# Patient Record
Sex: Female | Born: 1970 | Race: Black or African American | Hispanic: No | Marital: Married | Smoking: Never smoker
Health system: Southern US, Community
[De-identification: ages and names within clinical notes are randomized; demographics above are authoritative.]

## PROBLEM LIST (undated history)

## (undated) DIAGNOSIS — Z973 Presence of spectacles and contact lenses: Secondary | ICD-10-CM

## (undated) DIAGNOSIS — E119 Type 2 diabetes mellitus without complications: Secondary | ICD-10-CM

## (undated) DIAGNOSIS — M1990 Unspecified osteoarthritis, unspecified site: Secondary | ICD-10-CM

## (undated) DIAGNOSIS — E039 Hypothyroidism, unspecified: Secondary | ICD-10-CM

## (undated) DIAGNOSIS — Z862 Personal history of diseases of the blood and blood-forming organs and certain disorders involving the immune mechanism: Secondary | ICD-10-CM

## (undated) DIAGNOSIS — N393 Stress incontinence (female) (male): Secondary | ICD-10-CM

## (undated) HISTORY — PX: BREAST CYST ASPIRATION: SHX578

## (undated) HISTORY — DX: Hypothyroidism, unspecified: E03.9

---

## 1997-05-04 HISTORY — PX: KNEE ARTHROSCOPY: SUR90

## 2000-12-28 ENCOUNTER — Other Ambulatory Visit: Admission: RE | Admit: 2000-12-28 | Discharge: 2000-12-28 | Payer: Self-pay | Admitting: Obstetrics and Gynecology

## 2002-01-27 ENCOUNTER — Other Ambulatory Visit: Admission: RE | Admit: 2002-01-27 | Discharge: 2002-01-27 | Payer: Self-pay | Admitting: Obstetrics and Gynecology

## 2003-06-12 ENCOUNTER — Other Ambulatory Visit: Admission: RE | Admit: 2003-06-12 | Discharge: 2003-06-12 | Payer: Self-pay | Admitting: Obstetrics and Gynecology

## 2004-11-20 ENCOUNTER — Other Ambulatory Visit: Admission: RE | Admit: 2004-11-20 | Discharge: 2004-11-20 | Payer: Self-pay | Admitting: Obstetrics and Gynecology

## 2004-11-26 ENCOUNTER — Ambulatory Visit (HOSPITAL_COMMUNITY): Admission: RE | Admit: 2004-11-26 | Discharge: 2004-11-26 | Payer: Self-pay | Admitting: Obstetrics and Gynecology

## 2004-12-05 ENCOUNTER — Encounter: Admission: RE | Admit: 2004-12-05 | Discharge: 2004-12-05 | Payer: Self-pay | Admitting: Obstetrics and Gynecology

## 2004-12-12 ENCOUNTER — Encounter (INDEPENDENT_AMBULATORY_CARE_PROVIDER_SITE_OTHER): Payer: Self-pay | Admitting: Specialist

## 2004-12-12 ENCOUNTER — Encounter: Admission: RE | Admit: 2004-12-12 | Discharge: 2004-12-12 | Payer: Self-pay | Admitting: Obstetrics and Gynecology

## 2005-02-11 ENCOUNTER — Encounter: Admission: RE | Admit: 2005-02-11 | Discharge: 2005-02-11 | Payer: Self-pay | Admitting: Internal Medicine

## 2005-06-08 ENCOUNTER — Encounter (HOSPITAL_COMMUNITY): Admission: RE | Admit: 2005-06-08 | Discharge: 2005-09-06 | Payer: Self-pay | Admitting: Endocrinology

## 2005-12-17 ENCOUNTER — Other Ambulatory Visit: Admission: RE | Admit: 2005-12-17 | Discharge: 2005-12-17 | Payer: Self-pay | Admitting: Obstetrics and Gynecology

## 2005-12-30 ENCOUNTER — Encounter: Admission: RE | Admit: 2005-12-30 | Discharge: 2005-12-30 | Payer: Self-pay | Admitting: Obstetrics and Gynecology

## 2007-04-16 IMAGING — MG MM SCREEN MAMMOGRAM BILATERAL
4 series · 4 of 4 positions shown · non-contrast
Comparison: none

DG SCREEN MAMMOGRAM BILATERAL
Bilateral CC and MLO view(s) were taken.
Prior study comparison: November 26, 2004, bilateral screening mammogram, performed at [facility]

SCREENING MAMMOGRAM:
The breast tissue is heterogeneously dense.  Previously biopsied fibroadenoma, right breast, is 
stable.  There is no dominant mass, architectural distortion or calcification to suggest 
malignancy.

[R CC]
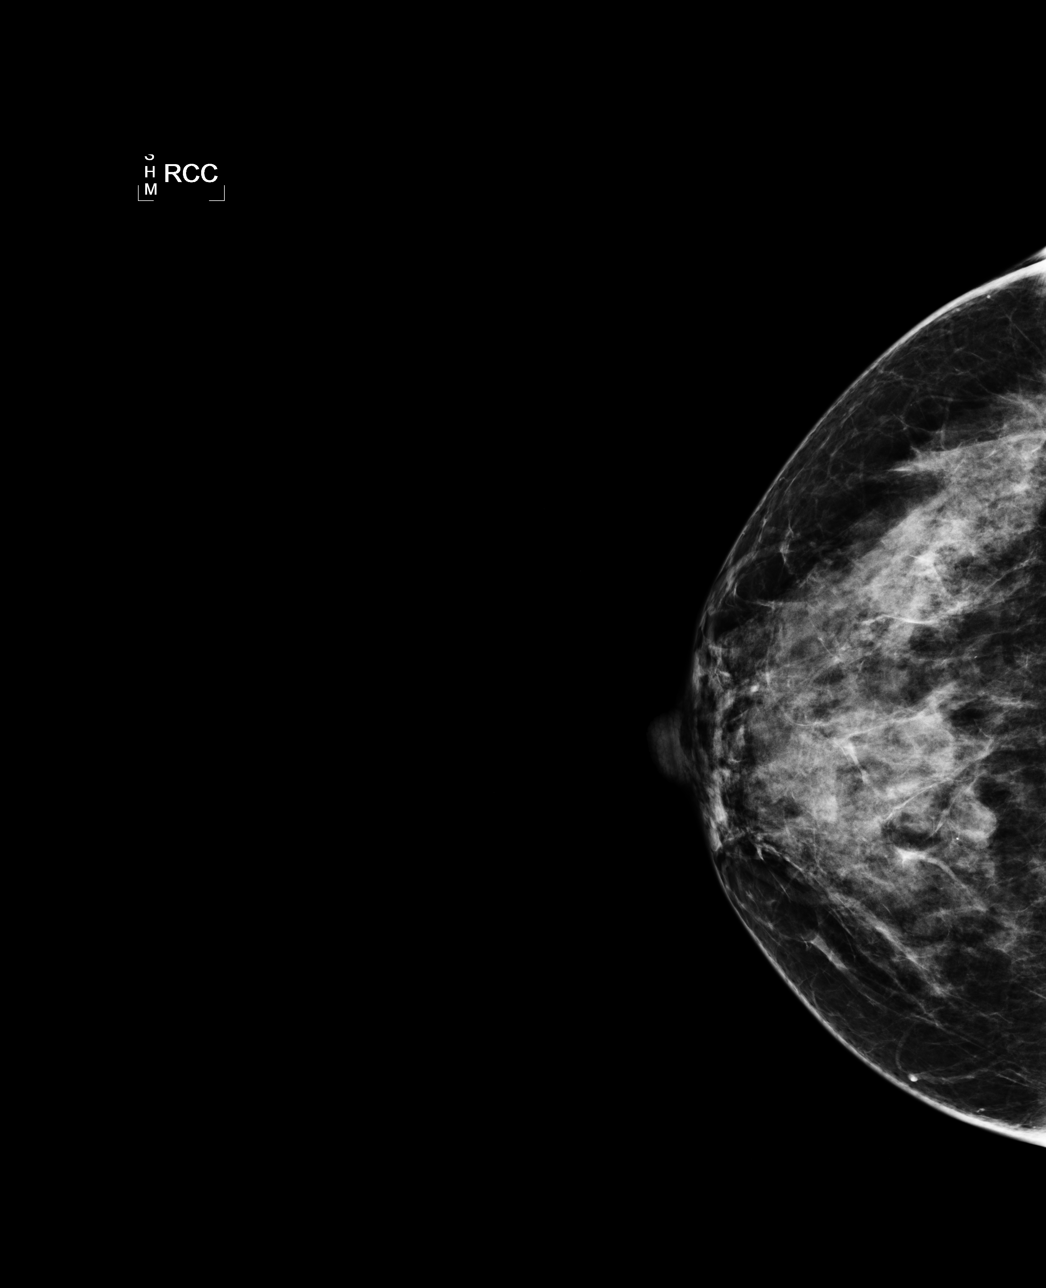

[L CC]
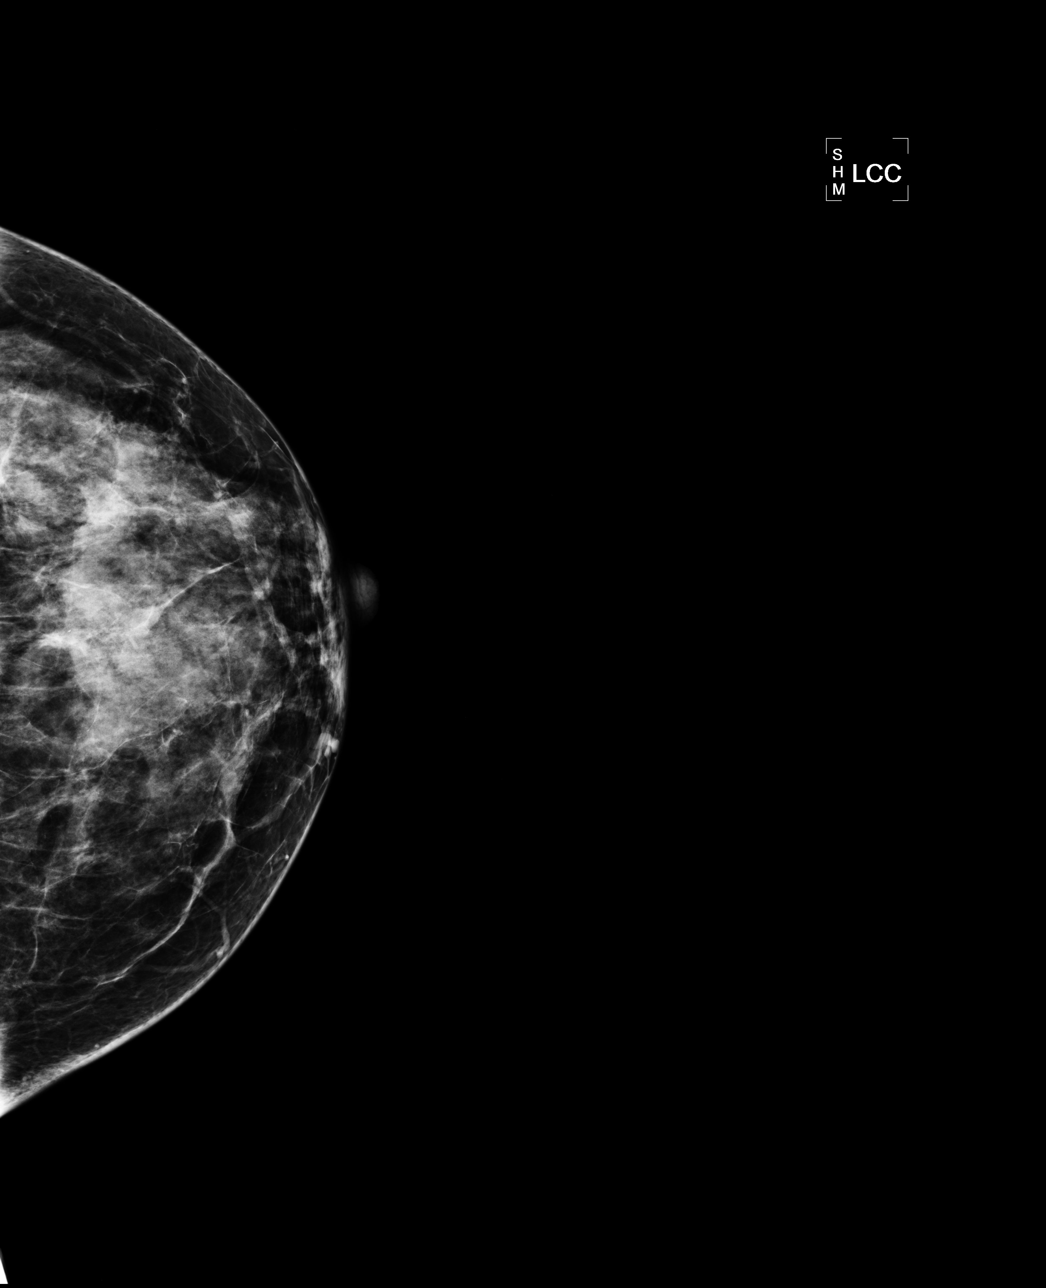

[L MLO]
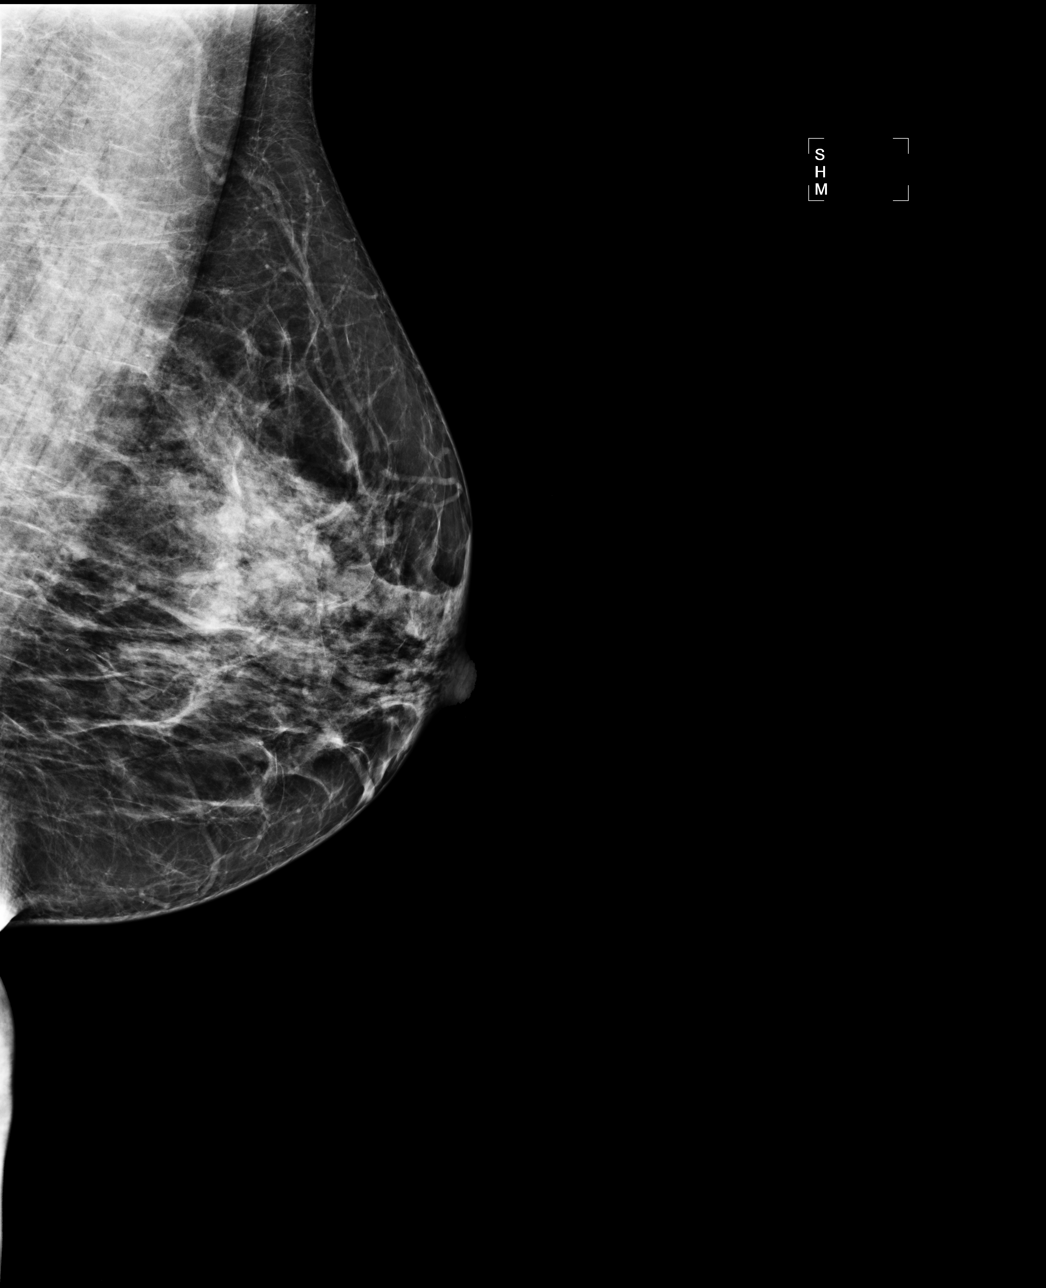

[R MLO]
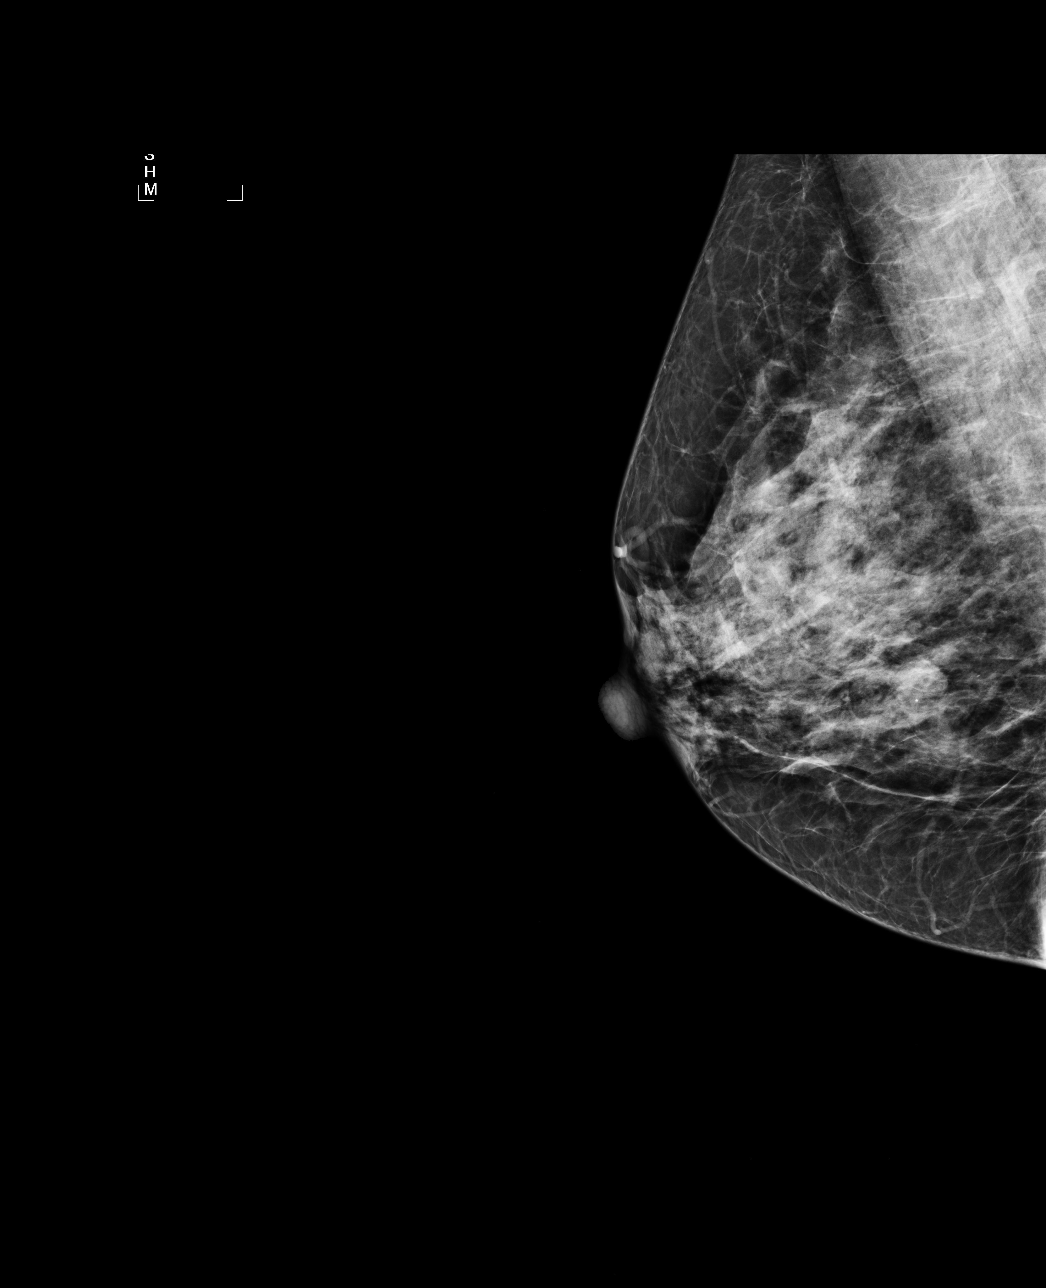

[4 of 4 positions shown; findings below may reference images not displayed]

IMPRESSION: No mammographic evidence of malignancy.  Suggest yearly screening mammography.

ASSESSMENT: Negative - BI-RADS 1

Screening mammogram in 1 year.
ANALYZED BY COMPUTER AIDED DETECTION. , THIS PROCEDURE WAS A DIGITAL MAMMOGRAM.

## 2007-12-07 ENCOUNTER — Encounter: Admission: RE | Admit: 2007-12-07 | Discharge: 2007-12-07 | Payer: Self-pay | Admitting: Obstetrics and Gynecology

## 2007-12-12 ENCOUNTER — Other Ambulatory Visit: Admission: RE | Admit: 2007-12-12 | Discharge: 2007-12-12 | Payer: Self-pay | Admitting: Diagnostic Radiology

## 2007-12-12 ENCOUNTER — Encounter (INDEPENDENT_AMBULATORY_CARE_PROVIDER_SITE_OTHER): Payer: Self-pay | Admitting: Diagnostic Radiology

## 2007-12-12 ENCOUNTER — Encounter: Admission: RE | Admit: 2007-12-12 | Discharge: 2007-12-12 | Payer: Self-pay | Admitting: Obstetrics and Gynecology

## 2008-12-12 ENCOUNTER — Encounter: Admission: RE | Admit: 2008-12-12 | Discharge: 2008-12-12 | Payer: Self-pay | Admitting: Internal Medicine

## 2009-03-12 ENCOUNTER — Ambulatory Visit (HOSPITAL_COMMUNITY): Admission: RE | Admit: 2009-03-12 | Discharge: 2009-03-12 | Payer: Self-pay | Admitting: Obstetrics and Gynecology

## 2009-03-12 ENCOUNTER — Encounter (INDEPENDENT_AMBULATORY_CARE_PROVIDER_SITE_OTHER): Payer: Self-pay | Admitting: Obstetrics and Gynecology

## 2009-03-12 HISTORY — PX: HYSTEROSCOPY W/ ENDOMETRIAL ABLATION: SUR665

## 2010-02-06 ENCOUNTER — Encounter: Admission: RE | Admit: 2010-02-06 | Discharge: 2010-02-06 | Payer: Self-pay | Admitting: Obstetrics and Gynecology

## 2010-05-04 HISTORY — PX: BREAST BIOPSY: SHX20

## 2010-05-24 ENCOUNTER — Encounter: Payer: Self-pay | Admitting: Obstetrics and Gynecology

## 2010-05-25 ENCOUNTER — Encounter: Payer: Self-pay | Admitting: Endocrinology

## 2010-08-06 LAB — CBC
HCT: 36.3 % (ref 36.0–46.0)
Hemoglobin: 12.1 g/dL (ref 12.0–15.0)
MCHC: 33.3 g/dL (ref 30.0–36.0)
MCV: 86.3 fL (ref 78.0–100.0)
Platelets: 319 10*3/uL (ref 150–400)
RBC: 4.21 MIL/uL (ref 3.87–5.11)
RDW: 13.7 % (ref 11.5–15.5)
WBC: 3.5 10*3/uL — ABNORMAL LOW (ref 4.0–10.5)

## 2010-08-06 LAB — HCG, SERUM, QUALITATIVE: Preg, Serum: NEGATIVE

## 2011-01-27 ENCOUNTER — Other Ambulatory Visit: Payer: Self-pay | Admitting: Internal Medicine

## 2011-01-27 DIAGNOSIS — Z1231 Encounter for screening mammogram for malignant neoplasm of breast: Secondary | ICD-10-CM

## 2011-02-09 ENCOUNTER — Ambulatory Visit
Admission: RE | Admit: 2011-02-09 | Discharge: 2011-02-09 | Disposition: A | Discharge status: Home / Self Care | Payer: BC Managed Care – PPO | Source: Ambulatory Visit | Attending: Internal Medicine | Admitting: Internal Medicine

## 2011-02-09 DIAGNOSIS — Z1231 Encounter for screening mammogram for malignant neoplasm of breast: Secondary | ICD-10-CM

## 2011-02-17 ENCOUNTER — Other Ambulatory Visit: Payer: Self-pay | Admitting: Internal Medicine

## 2011-02-17 DIAGNOSIS — R928 Other abnormal and inconclusive findings on diagnostic imaging of breast: Secondary | ICD-10-CM

## 2011-02-27 ENCOUNTER — Ambulatory Visit
Admission: RE | Admit: 2011-02-27 | Discharge: 2011-02-27 | Disposition: A | Discharge status: Home / Self Care | Payer: BC Managed Care – PPO | Source: Ambulatory Visit | Attending: Internal Medicine | Admitting: Internal Medicine

## 2011-02-27 ENCOUNTER — Other Ambulatory Visit: Payer: Self-pay | Admitting: Diagnostic Radiology

## 2011-02-27 ENCOUNTER — Other Ambulatory Visit: Payer: Self-pay | Admitting: Internal Medicine

## 2011-02-27 DIAGNOSIS — R928 Other abnormal and inconclusive findings on diagnostic imaging of breast: Secondary | ICD-10-CM

## 2011-03-03 ENCOUNTER — Other Ambulatory Visit: Payer: Self-pay | Admitting: Internal Medicine

## 2011-03-03 DIAGNOSIS — Z803 Family history of malignant neoplasm of breast: Secondary | ICD-10-CM

## 2012-03-08 ENCOUNTER — Other Ambulatory Visit: Payer: Self-pay | Admitting: Internal Medicine

## 2012-03-08 DIAGNOSIS — Z1231 Encounter for screening mammogram for malignant neoplasm of breast: Secondary | ICD-10-CM

## 2012-04-13 ENCOUNTER — Encounter: Payer: Self-pay | Admitting: Obstetrics and Gynecology

## 2012-04-13 ENCOUNTER — Ambulatory Visit (INDEPENDENT_AMBULATORY_CARE_PROVIDER_SITE_OTHER): Payer: BC Managed Care – PPO | Admitting: Obstetrics and Gynecology

## 2012-04-13 VITALS — BP 120/78 | Resp 14 | Ht 67.0 in | Wt 186.0 lb

## 2012-04-13 DIAGNOSIS — Z01419 Encounter for gynecological examination (general) (routine) without abnormal findings: Secondary | ICD-10-CM

## 2012-04-13 DIAGNOSIS — R350 Frequency of micturition: Secondary | ICD-10-CM

## 2012-04-13 DIAGNOSIS — Z124 Encounter for screening for malignant neoplasm of cervix: Secondary | ICD-10-CM

## 2012-04-13 LAB — POCT URINALYSIS DIPSTICK
Glucose, UA: NEGATIVE
Leukocytes, UA: NEGATIVE
Nitrite, UA: NEGATIVE
Urobilinogen, UA: NEGATIVE

## 2012-04-13 NOTE — Progress Notes (Signed)
Patient ID: Hettie Holstein, female   DOB: 01-07-1971, 41 y.o.   MRN: [id number] Contraception Vas Last pap 04/2011 wnl  Last Mammo 02/2011 Last Colonoscopy never Last Dexa Scan never Primary MD Allyne Gee Abuse at Home none  No complaints.  Scheduled for mammo tomorrow.  Filed Vitals:   04/13/12 1116  BP: 120/78  Resp: 14   ROS: noncontributory  Physical Examination: General appearance - alert, well appearing, and in no distress Neck - supple, no significant adenopathy Chest - clear to auscultation, no wheezes, rales or rhonchi, symmetric air entry Heart - normal rate and regular rhythm Abdomen - soft, nontender, nondistended, no masses or organomegaly Breasts - breasts appear normal, no suspicious masses, no skin or nipple changes or axillary nodes Pelvic - normal external genitalia, vulva, vagina, cervix, uterus and adnexa Back exam - no CVAT Extremities - no edema, redness or tenderness in the calves or thighs  A/P Pap today RTO 26yr for AEX UA - tr prot send for cx

## 2012-04-13 NOTE — Addendum Note (Signed)
Addended by: Marla Roe A on: 04/13/2012 12:27 PM   Modules accepted: Orders

## 2012-04-14 ENCOUNTER — Ambulatory Visit
Admission: RE | Admit: 2012-04-14 | Discharge: 2012-04-14 | Disposition: A | Discharge status: Home / Self Care | Payer: BC Managed Care – PPO | Source: Ambulatory Visit | Attending: Internal Medicine | Admitting: Internal Medicine

## 2012-04-14 DIAGNOSIS — Z1231 Encounter for screening mammogram for malignant neoplasm of breast: Secondary | ICD-10-CM

## 2012-04-14 LAB — PAP IG W/ RFLX HPV ASCU

## 2012-04-15 LAB — URINE CULTURE
Colony Count: NO GROWTH
Organism ID, Bacteria: NO GROWTH

## 2013-04-03 ENCOUNTER — Other Ambulatory Visit: Payer: Self-pay | Admitting: Internal Medicine

## 2013-04-03 DIAGNOSIS — Z1231 Encounter for screening mammogram for malignant neoplasm of breast: Secondary | ICD-10-CM

## 2013-05-15 ENCOUNTER — Ambulatory Visit
Admission: RE | Admit: 2013-05-15 | Discharge: 2013-05-15 | Disposition: A | Discharge status: Home / Self Care | Payer: BC Managed Care – PPO | Source: Ambulatory Visit | Attending: Internal Medicine | Admitting: Internal Medicine

## 2013-05-15 DIAGNOSIS — Z1231 Encounter for screening mammogram for malignant neoplasm of breast: Secondary | ICD-10-CM

## 2014-05-22 ENCOUNTER — Other Ambulatory Visit: Payer: Self-pay

## 2014-05-22 DIAGNOSIS — Z1231 Encounter for screening mammogram for malignant neoplasm of breast: Secondary | ICD-10-CM

## 2014-05-29 ENCOUNTER — Ambulatory Visit
Admission: RE | Admit: 2014-05-29 | Discharge: 2014-05-29 | Disposition: A | Discharge status: Home / Self Care | Payer: BC Managed Care – PPO | Source: Ambulatory Visit

## 2014-05-29 DIAGNOSIS — Z1231 Encounter for screening mammogram for malignant neoplasm of breast: Secondary | ICD-10-CM

## 2014-05-30 ENCOUNTER — Other Ambulatory Visit: Payer: Self-pay | Admitting: Internal Medicine

## 2014-05-30 DIAGNOSIS — R928 Other abnormal and inconclusive findings on diagnostic imaging of breast: Secondary | ICD-10-CM

## 2014-06-04 ENCOUNTER — Ambulatory Visit
Admission: RE | Admit: 2014-06-04 | Discharge: 2014-06-04 | Disposition: A | Discharge status: Home / Self Care | Payer: BC Managed Care – PPO | Source: Ambulatory Visit | Attending: Internal Medicine | Admitting: Internal Medicine

## 2014-06-04 ENCOUNTER — Other Ambulatory Visit: Payer: Self-pay | Admitting: Internal Medicine

## 2014-06-04 DIAGNOSIS — R928 Other abnormal and inconclusive findings on diagnostic imaging of breast: Secondary | ICD-10-CM

## 2014-06-15 ENCOUNTER — Ambulatory Visit
Admission: RE | Admit: 2014-06-15 | Discharge: 2014-06-15 | Disposition: A | Discharge status: Home / Self Care | Payer: BC Managed Care – PPO | Source: Ambulatory Visit | Attending: Internal Medicine | Admitting: Internal Medicine

## 2014-06-15 DIAGNOSIS — R928 Other abnormal and inconclusive findings on diagnostic imaging of breast: Secondary | ICD-10-CM

## 2015-09-30 IMAGING — US US ASPIRATION
1 series · 8 of 8 positions shown · non-contrast
Comparison: Previous exams.

CLINICAL DATA: 43-year-old female with multiple left breast cysts,
focal pain and family history of breast cancer. Patient desires
aspiration of the largest cysts in the upper outer left breast.

EXAM:
ULTRASOUND GUIDED LEFT BREAST CYST ASPIRATION

[Series 1: us aspiration · 8 of 8 slices shown]
[im 1/8]
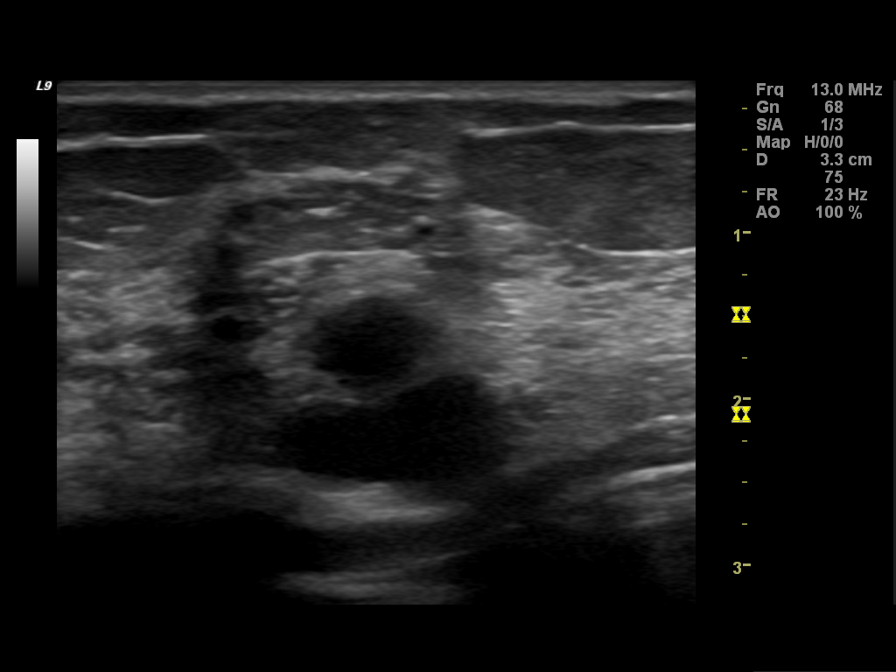
[im 2/8]
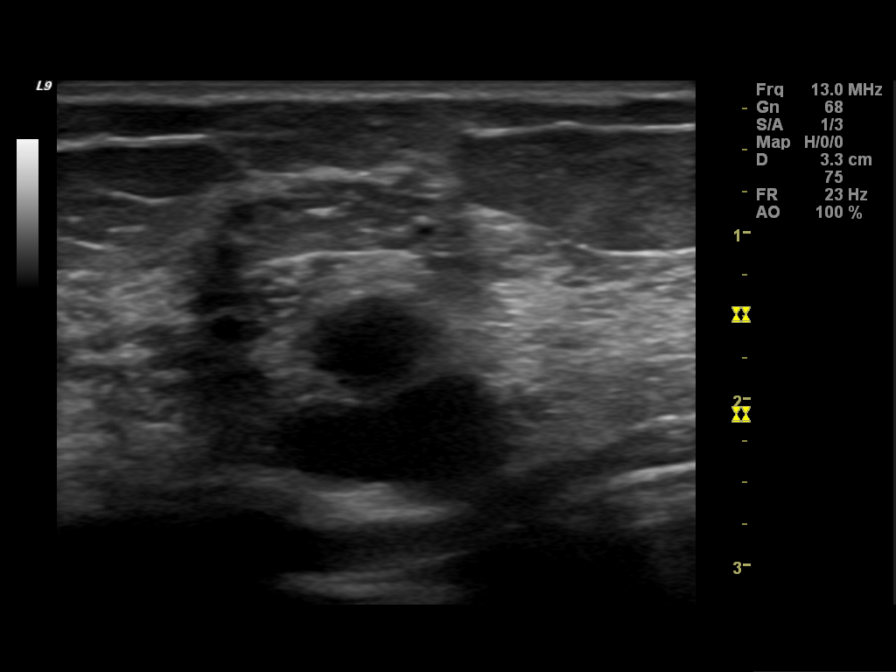
[im 3/8]
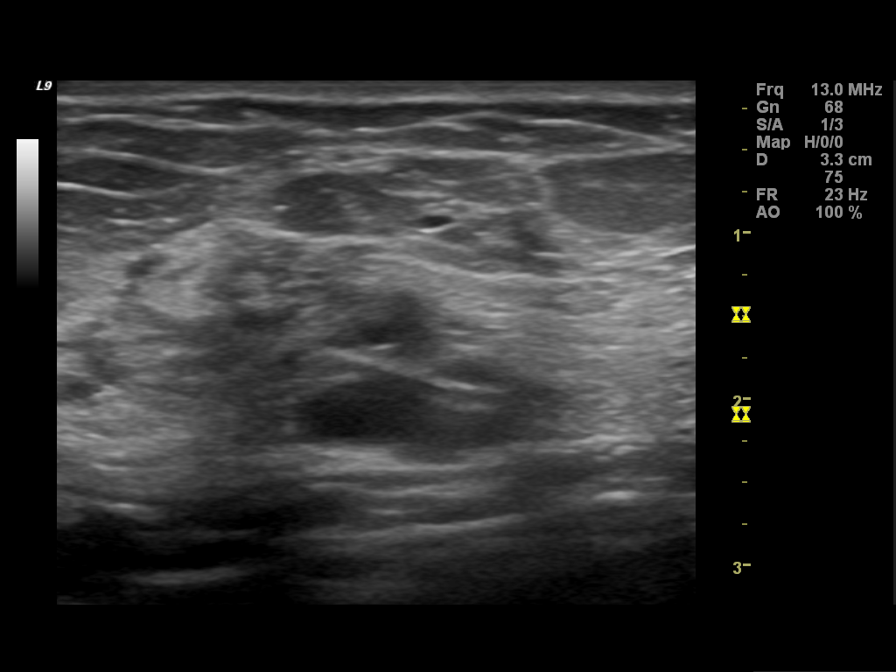
[im 4/8]
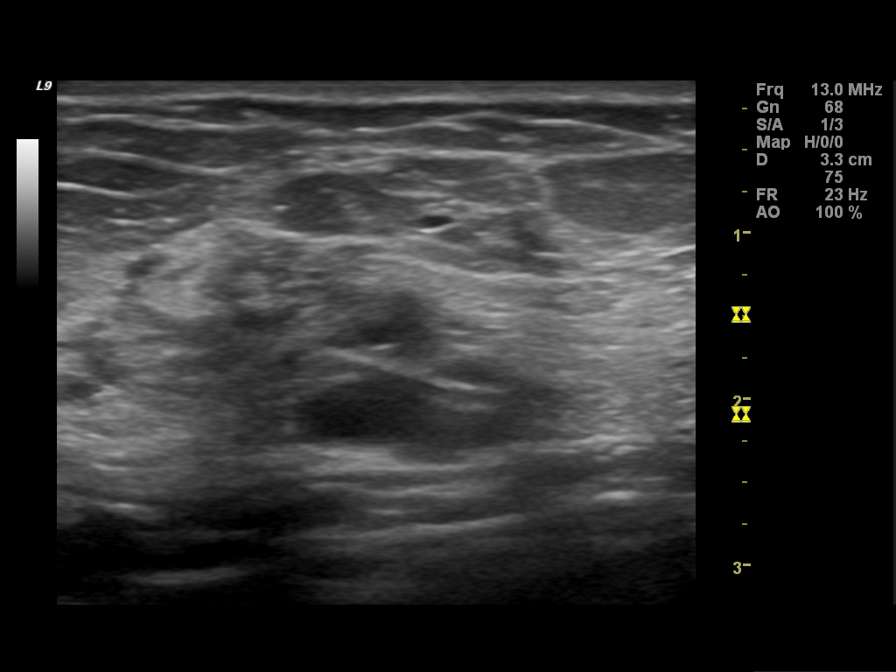
[im 5/8]
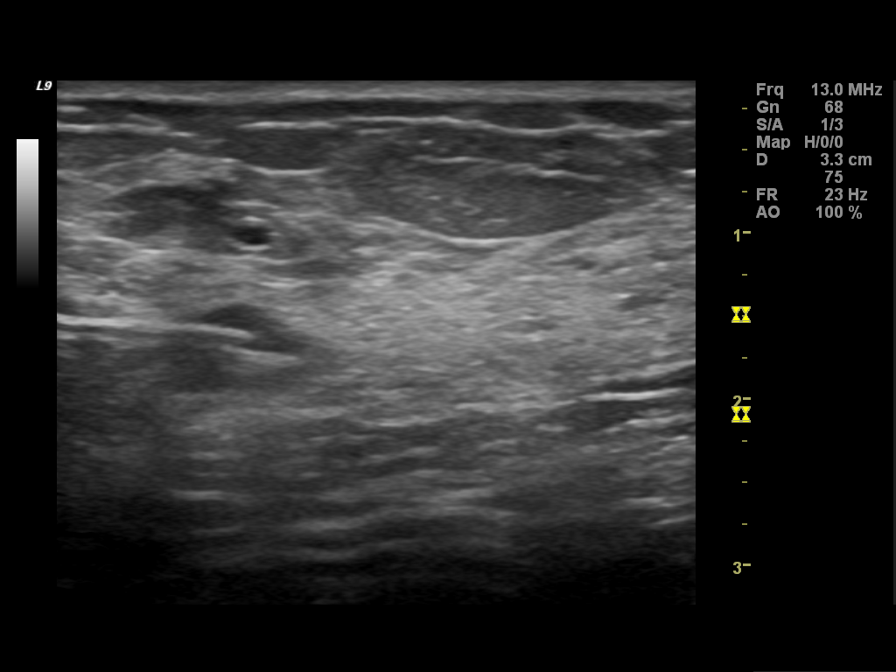
[im 6/8]
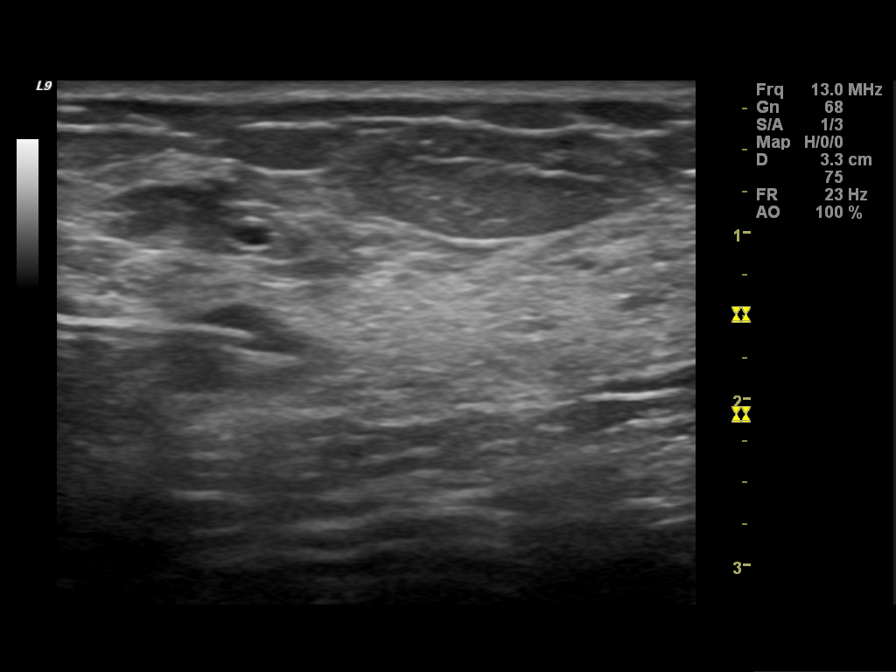
[im 7/8]
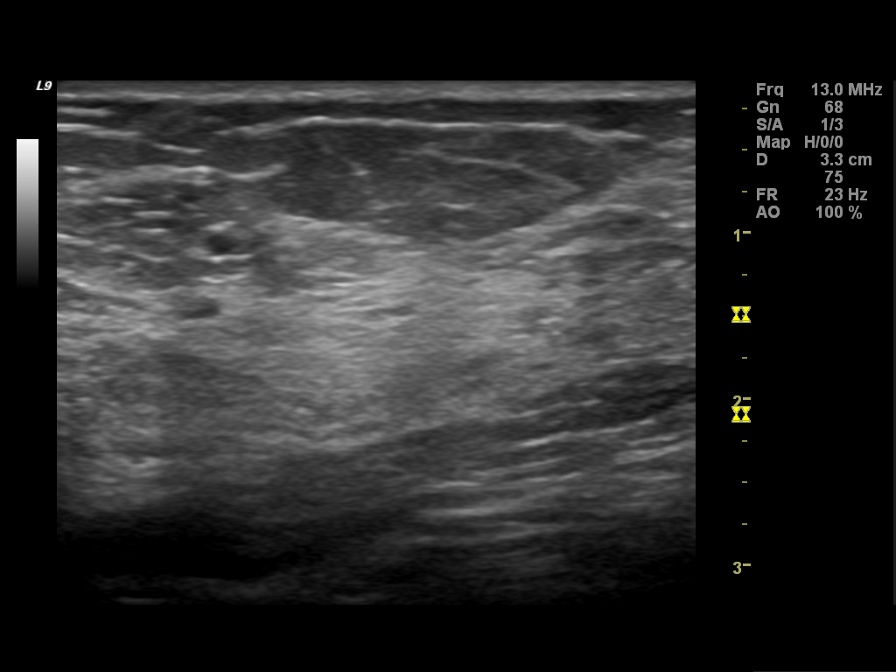
[im 8/8]
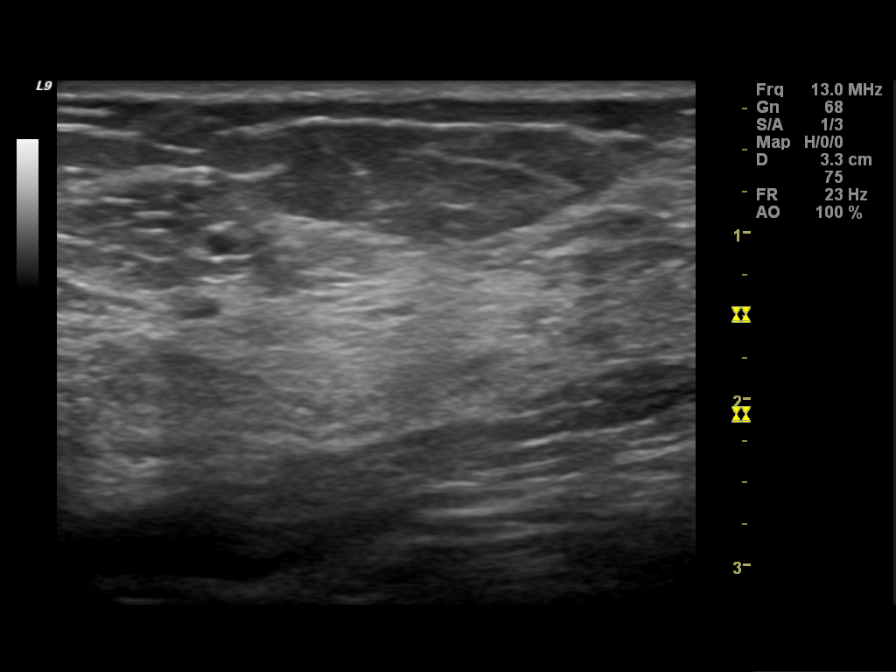

[8 of 8 positions shown; findings below may reference images not displayed]

PROCEDURE:
The risks and benefits of cyst aspiration were discussed with the
patient. She understands that the cysts may not be the source of her
breast pain and also may recur after aspiration.

Using sterile technique, 1% lidocaine, under direct ultrasound
visualization, needle aspiration of the 2 adjacent simple cysts at
the 2 o'clock position of the left breast 4 cm from the nipple was
performed. The largest cyst measured 1.4 x 0.7 cm. Complete
aspiration of these simple cysts was performed without residual soft
tissue component or mass.

1 cc of greenish brown fluid was aspirated and discarded.
IMPRESSION: Ultrasound-guided aspiration of benign cysts in the upper outer left
breast.. No apparent complications.

RECOMMENDATIONS:
Bilateral screening mammograms in 1 year.

## 2016-02-03 ENCOUNTER — Other Ambulatory Visit: Payer: Self-pay | Admitting: Obstetrics and Gynecology

## 2016-02-03 DIAGNOSIS — Z1231 Encounter for screening mammogram for malignant neoplasm of breast: Secondary | ICD-10-CM

## 2016-02-14 ENCOUNTER — Ambulatory Visit
Admission: RE | Admit: 2016-02-14 | Discharge: 2016-02-14 | Disposition: A | Discharge status: Home / Self Care | Payer: BC Managed Care – PPO | Source: Ambulatory Visit | Attending: Obstetrics and Gynecology | Admitting: Obstetrics and Gynecology

## 2016-02-14 DIAGNOSIS — Z1231 Encounter for screening mammogram for malignant neoplasm of breast: Secondary | ICD-10-CM

## 2016-09-30 LAB — LIPID PANEL
HDL: 50 (ref 35–70)
LDL Cholesterol: 123
LDl/HDL Ratio: 2.5
Triglycerides: 73 (ref 40–160)

## 2017-03-22 ENCOUNTER — Other Ambulatory Visit: Payer: Self-pay | Admitting: Internal Medicine

## 2017-03-22 DIAGNOSIS — Z1231 Encounter for screening mammogram for malignant neoplasm of breast: Secondary | ICD-10-CM

## 2017-03-30 ENCOUNTER — Ambulatory Visit
Admission: RE | Admit: 2017-03-30 | Discharge: 2017-03-30 | Disposition: A | Discharge status: Home / Self Care | Payer: BC Managed Care – PPO | Source: Ambulatory Visit | Attending: Internal Medicine | Admitting: Internal Medicine

## 2017-03-30 DIAGNOSIS — Z1231 Encounter for screening mammogram for malignant neoplasm of breast: Secondary | ICD-10-CM

## 2017-04-01 ENCOUNTER — Other Ambulatory Visit: Payer: Self-pay | Admitting: Internal Medicine

## 2017-04-01 DIAGNOSIS — R928 Other abnormal and inconclusive findings on diagnostic imaging of breast: Secondary | ICD-10-CM

## 2017-04-07 ENCOUNTER — Other Ambulatory Visit: Payer: Self-pay | Admitting: Internal Medicine

## 2017-04-07 ENCOUNTER — Ambulatory Visit
Admission: RE | Admit: 2017-04-07 | Discharge: 2017-04-07 | Disposition: A | Discharge status: Home / Self Care | Payer: BC Managed Care – PPO | Source: Ambulatory Visit | Attending: Internal Medicine | Admitting: Internal Medicine

## 2017-04-07 DIAGNOSIS — R928 Other abnormal and inconclusive findings on diagnostic imaging of breast: Secondary | ICD-10-CM

## 2017-04-23 ENCOUNTER — Other Ambulatory Visit: Payer: Self-pay | Admitting: Internal Medicine

## 2017-04-23 DIAGNOSIS — R9230 Dense breasts, unspecified: Secondary | ICD-10-CM

## 2017-04-23 DIAGNOSIS — R922 Inconclusive mammogram: Secondary | ICD-10-CM

## 2017-04-25 LAB — BASIC METABOLIC PANEL
BUN: 7 (ref 4–21)
Creatinine: 0.9 (ref 0.5–1.1)
Glucose: 85
Potassium: 4 (ref 3.4–5.3)
Sodium: 138 (ref 137–147)

## 2017-04-25 LAB — TSH: TSH: 2.03 (ref 0.41–5.90)

## 2017-04-25 LAB — HEMOGLOBIN A1C: HEMOGLOBIN A1C: 6 (ref 4.0–6.0)

## 2017-05-02 ENCOUNTER — Other Ambulatory Visit: Payer: BC Managed Care – PPO

## 2017-05-07 ENCOUNTER — Inpatient Hospital Stay
Admission: RE | Admit: 2017-05-07 | Discharge: 2017-05-07 | Disposition: A | Discharge status: Home / Self Care | Payer: BC Managed Care – PPO | Source: Ambulatory Visit | Attending: Internal Medicine | Admitting: Internal Medicine

## 2018-01-12 LAB — HM PAP SMEAR

## 2018-02-19 ENCOUNTER — Other Ambulatory Visit: Payer: Self-pay | Admitting: Internal Medicine

## 2018-02-21 ENCOUNTER — Ambulatory Visit: Payer: BC Managed Care – PPO | Admitting: Nurse Practitioner

## 2018-02-21 ENCOUNTER — Encounter: Payer: Self-pay | Admitting: Nurse Practitioner

## 2018-02-21 VITALS — BP 116/72 | HR 72 | Temp 97.9°F | Ht 66.0 in | Wt 210.2 lb

## 2018-02-21 DIAGNOSIS — E039 Hypothyroidism, unspecified: Secondary | ICD-10-CM | POA: Diagnosis not present

## 2018-02-21 DIAGNOSIS — R7303 Prediabetes: Secondary | ICD-10-CM | POA: Diagnosis not present

## 2018-02-21 LAB — TSH: TSH: 2.37 (ref 0.41–5.90)

## 2018-02-21 NOTE — Progress Notes (Signed)
  Subjective:     Patient ID: Kara Fry , female    DOB: 1970-10-20 , 47 y.o.   MRN: [id number]   Thyroid Problem  Presents for follow-up visit. Patient reports no constipation, fatigue, menstrual problem, palpitations or weight gain. The symptoms have been stable.     No past medical history on file.    Current Outpatient Medications:  .  levothyroxine (SYNTHROID, LEVOTHROID) 88 MCG tablet, Take 88 mcg by mouth daily., Disp: , Rfl:    Allergies  Allergen Reactions  . Penicillins   . Shellfish Allergy      Review of Systems  Constitutional: Negative for chills, fatigue and weight gain.  Cardiovascular: Negative for palpitations.  Gastrointestinal: Negative for constipation and nausea.  Genitourinary: Negative for menstrual problem.  Skin: Negative.   Neurological: Negative for dizziness, syncope, light-headedness and headaches.     Today's Vitals   02/21/18 1117  BP: 116/72  Pulse: 72  Temp: 97.9 F (36.6 C)  TempSrc: Oral  SpO2: 96%  Weight: 210 lb 3.2 oz (95.3 kg)  Height: 5\' 6"  (1.676 m)  PainSc: 0-No pain   Body mass index is 33.93 kg/m.   Objective:  Physical Exam  Constitutional: She is oriented to person, place, and time. She appears well-developed and well-nourished.  Cardiovascular: Normal rate, regular rhythm and normal heart sounds.  Pulmonary/Chest: Effort normal and breath sounds normal.  Neurological: She is alert and oriented to person, place, and time.  Skin: Skin is warm and dry.        Assessment And Plan:     1. Acquired hypothyroidism  Chronic, controlled  Continue with current medications, will make changes pending lab results - TSH - T3, free - T4  2. Prediabetes  Chronic, controlled  No current medications  Encouraged to increase physical activity and eat healthy diet. - Hemoglobin A1c        Arnette Felts, FNP

## 2018-02-22 LAB — T3, FREE: T3, Free: 2.6 pg/mL (ref 2.0–4.4)

## 2018-02-22 LAB — TSH: TSH: 2.37 u[IU]/mL (ref 0.450–4.500)

## 2018-02-22 LAB — HEMOGLOBIN A1C
Est. average glucose Bld gHb Est-mCnc: 128 mg/dL
HEMOGLOBIN A1C: 6.1 % — AB (ref 4.8–5.6)

## 2018-02-22 LAB — T4: T4, Total: 10.9 ug/dL (ref 4.5–12.0)

## 2018-03-21 ENCOUNTER — Encounter: Payer: Self-pay | Admitting: Internal Medicine

## 2018-03-21 ENCOUNTER — Ambulatory Visit: Payer: BC Managed Care – PPO | Admitting: Internal Medicine

## 2018-03-21 VITALS — BP 116/70 | HR 80 | Temp 98.0°F | Ht 66.5 in | Wt 211.0 lb

## 2018-03-21 DIAGNOSIS — N6320 Unspecified lump in the left breast, unspecified quadrant: Secondary | ICD-10-CM

## 2018-03-21 DIAGNOSIS — Z0000 Encounter for general adult medical examination without abnormal findings: Secondary | ICD-10-CM

## 2018-03-21 DIAGNOSIS — E039 Hypothyroidism, unspecified: Secondary | ICD-10-CM | POA: Insufficient documentation

## 2018-03-21 LAB — CMP14+EGFR
ALBUMIN: 4.1 g/dL (ref 3.5–5.5)
ALT: 16 IU/L (ref 0–32)
AST: 12 IU/L (ref 0–40)
Albumin/Globulin Ratio: 1.4 (ref 1.2–2.2)
Alkaline Phosphatase: 90 IU/L (ref 39–117)
BUN/Creatinine Ratio: 11 (ref 9–23)
BUN: 10 mg/dL (ref 6–24)
Bilirubin Total: 0.3 mg/dL (ref 0.0–1.2)
CHLORIDE: 101 mmol/L (ref 96–106)
CO2: 20 mmol/L (ref 20–29)
Calcium: 9.3 mg/dL (ref 8.7–10.2)
Creatinine, Ser: 0.94 mg/dL (ref 0.57–1.00)
GFR calc Af Amer: 82 mL/min/{1.73_m2} (ref >59)
GFR calc non Af Amer: 71 mL/min/{1.73_m2} (ref >59)
Globulin, Total: 3 g/dL (ref 1.5–4.5)
Glucose: 90 mg/dL (ref 65–99)
Potassium: 4.2 mmol/L (ref 3.5–5.2)
Sodium: 136 mmol/L (ref 134–144)
Total Protein: 7.1 g/dL (ref 6.0–8.5)

## 2018-03-21 LAB — CBC
HEMATOCRIT: 37.8 % (ref 34.0–46.6)
Hemoglobin: 12.8 g/dL (ref 11.1–15.9)
MCH: 28.4 pg (ref 26.6–33.0)
MCHC: 33.9 g/dL (ref 31.5–35.7)
MCV: 84 fL (ref 79–97)
PLATELETS: 392 10*3/uL (ref 150–450)
RBC: 4.5 x10E6/uL (ref 3.77–5.28)
RDW: 14 % (ref 12.3–15.4)
WBC: 5.2 10*3/uL (ref 3.4–10.8)

## 2018-03-21 LAB — LIPID PANEL
CHOL/HDL RATIO: 4.2 ratio (ref 0.0–4.4)
Cholesterol, Total: 220 mg/dL — ABNORMAL HIGH (ref 100–199)
HDL: 53 mg/dL (ref >39)
LDL Calculated: 151 mg/dL — ABNORMAL HIGH (ref 0–99)
TRIGLYCERIDES: 81 mg/dL (ref 0–149)
VLDL Cholesterol Cal: 16 mg/dL (ref 5–40)

## 2018-03-21 LAB — POCT URINALYSIS DIPSTICK
BILIRUBIN UA: NEGATIVE
Glucose, UA: NEGATIVE
Ketones, UA: NEGATIVE
Nitrite, UA: NEGATIVE
Protein, UA: NEGATIVE
Spec Grav, UA: 1.01 (ref 1.010–1.025)
Urobilinogen, UA: 0.2 E.U./dL
pH, UA: 6.5 (ref 5.0–8.0)

## 2018-03-21 NOTE — Patient Instructions (Signed)
Breast Self-Awareness Breast self-awareness means:  Knowing how your breasts look.  Knowing how your breasts feel.  Checking your breasts every month for changes.  Telling your doctor if you notice a change in your breasts.  Breast self-awareness allows you to notice a breast problem early while it is still small. How to do a breast self-exam One way to learn what is normal for your breasts and to check for changes is to do a breast self-exam. To do a breast self-exam: Look for Changes  1. Take off all the clothes above your waist. 2. Stand in front of a mirror in a room with good lighting. 3. Put your hands on your hips. 4. Push your hands down. 5. Look at your breasts and nipples in the mirror to see if one breast or nipple looks different than the other. Check to see if: ? The shape of one breast is different. ? The size of one breast is different. ? There are wrinkles, dips, and bumps in one breast and not the other. 6. Look at each breast for changes in your skin, such as: ? Redness. ? Scaly areas. 7. Look for changes in your nipples, such as: ? Liquid around the nipples. ? Bleeding. ? Dimpling. ? Redness. ? A change in where the nipples are. Feel for Changes 1. Lie on your back on the floor. 2. Feel each breast. To do this, follow these steps: ? Pick a breast to feel. ? Put the arm closest to that breast above your head. ? Use your other arm to feel the nipple area of your breast. Feel the area with the pads of your three middle fingers by making small circles with your fingers. For the first circle, press lightly. For the second circle, press harder. For the third circle, press even harder. ? Keep making circles with your fingers at the light, harder, and even harder pressures as you move down your breast. Stop when you feel your ribs. ? Move your fingers a little toward the center of your body. ? Start making circles with your fingers again, this time going up until  you reach your collarbone. ? Keep making up and down circles until you reach your armpit. Remember to keep using the three pressures. ? Feel the other breast in the same way. 3. Sit or stand in the shower or tub. 4. With soapy water on your skin, feel each breast the same way you did in step 2, when you were lying on the floor. Write Down What You Find  After doing the self-exam, write down:  What is normal for each breast.  Any changes you find in each breast.  When you last had your period.  How often should I check my breasts? Check your breasts every month. If you are breastfeeding, the best time to check them is after you feed your baby or after you use a breast pump. If you get periods, the best time to check your breasts is 5-7 days after your period is over. When should I see my doctor? See your doctor if you notice:  A change in shape or size of your breasts or nipples.  A change in the skin of your breast or nipples, such as red or scaly skin.  Unusual fluid coming from your nipples.  A lump or thick area that was not there before.  Pain in your breasts.  Anything that concerns you.  This information is not intended to replace advice given to   you by your health care provider. Make sure you discuss any questions you have with your health care provider. Document Released: 10/07/2007 Document Revised: 09/26/2015 Document Reviewed: 03/10/2015 Elsevier Interactive Patient Education  2018 Elsevier Inc.  

## 2018-03-22 ENCOUNTER — Encounter: Payer: Self-pay | Admitting: Internal Medicine

## 2018-03-22 NOTE — Progress Notes (Signed)
Subjective:     Patient ID: Kara Fry , female    DOB: [birth year] , 47 y.o.   MRN: [id number]   Chief Complaint  Patient presents with  . Annual Exam    HPI  SHE IS HERE TODAY FOR A FULL PHYSICAL EXAM. SHE HAS HER GYN EXAMS PERFORMED BY DR. Mancel Bale. SHE ADDS THAT HER MAMMOGRAM WAS PERFORMED DURING THIS VISIT AS WELL. THIS WAS PERFORMED WITHIN THE LAST TWO MONTHS.    Past Medical History:  Diagnosis Date  . Hypothyroidism      Family History  Problem Relation Age of Onset  . Breast cancer Mother 19  . Breast cancer Sister 21  . Breast cancer Maternal Aunt   . Breast cancer Maternal Grandmother      Current Outpatient Medications:  .  SYNTHROID 100 MCG tablet, TAKE ONE TABLET MON-SAT AND 1/2 TAB ON SUN, Disp: 90 tablet, Rfl: 0   Allergies  Allergen Reactions  . Penicillins   . Shellfish Allergy      Review of Systems  Constitutional: Negative.   HENT: Negative.   Eyes: Negative.   Respiratory: Negative.   Cardiovascular: Negative.   Gastrointestinal: Negative.   Endocrine: Negative.   Genitourinary: Negative.   Musculoskeletal: Negative.   Skin: Negative.   Allergic/Immunologic: Negative.   Neurological: Negative.   Hematological: Negative.   Psychiatric/Behavioral: Negative.      Today's Vitals   03/21/18 1102  BP: 116/70  Pulse: 80  Temp: 98 F (36.7 C)  TempSrc: Oral  Weight: 211 lb (95.7 kg)  Height: 5' 6.5" (1.689 m)   Body mass index is 33.55 kg/m.   Objective:  Physical Exam  Constitutional: She is oriented to person, place, and time. She appears well-developed and well-nourished.  HENT:  Head: Normocephalic and atraumatic.  Right Ear: External ear normal.  Left Ear: External ear normal.  Nose: Nose normal.  Mouth/Throat: Oropharynx is clear and moist.  Eyes: Pupils are equal, round, and reactive to light. Conjunctivae and EOM are normal.  Neck: Normal range of motion. Neck supple.  Cardiovascular: Normal rate, regular  rhythm, normal heart sounds and intact distal pulses.  Pulmonary/Chest: Effort normal and breath sounds normal. Right breast exhibits no inverted nipple, no mass, no nipple discharge, no skin change and no tenderness. Left breast exhibits mass. Left breast exhibits no inverted nipple, no nipple discharge, no skin change and no tenderness.  TWO ROUND, NONTENDER MASSES APPRECIATED LATERAL TO LEFT NIPPLE.     Abdominal: Soft. Bowel sounds are normal.  Genitourinary:  Genitourinary Comments: deferred  Musculoskeletal: Normal range of motion.  Neurological: She is alert and oriented to person, place, and time.  Skin: Skin is warm and dry.  Psychiatric: She has a normal mood and affect.  Nursing note and vitals reviewed.       Assessment And Plan:     1. Routine general medical examination at health care facility  A full exam was performed. Importance of monthly self breast exams was discussed with the patient. I will request her most recent mammogram results from Dr. Mancel Bale.  PATIENT HAS BEEN ADVISED TO GET 30-45 MINUTES REGULAR EXERCISE NO LESS THAN FOUR TO FIVE DAYS PER WEEK - BOTH WEIGHTBEARING EXERCISES AND AEROBIC ARE RECOMMENDED.  SHE IS ADVISED TO FOLLOW A HEALTHY DIET WITH AT LEAST SIX FRUITS/VEGGIES PER DAY, DECREASE INTAKE OF RED MEAT, AND TO INCREASE FISH INTAKE TO TWO DAYS PER WEEK.  MEATS/FISH SHOULD NOT BE FRIED, BAKED OR BROILED IS PREFERABLE.  I  SUGGEST WEARING SPF 50 SUNSCREEN ON EXPOSED PARTS AND ESPECIALLY WHEN IN THE DIRECT SUNLIGHT FOR AN EXTENDED PERIOD OF TIME.  PLEASE AVOID FAST FOOD RESTAURANTS AND INCREASE YOUR WATER INTAKE.  - CMP14+EGFR - CBC - Lipid panel - POCT Urinalysis Dipstick (81002)   2. Breast mass, left  I will refer her for a left breast ultrasound and left breast diagnostic mammogram. She is in agreement with her treatment plan. I will also request her most recent mammogram results from Dr. Mancel Bale. Additionally, pt and I both reviewed 2018 mammogram  results from the [facility].   Maximino Greenland, MD

## 2018-03-22 NOTE — Progress Notes (Signed)
Your liver and kidney function are normal. Blood count is normal. Ldl, bad chol is 151. Ideally, this should be less than 100. Please exercise no less than five days weekly, avoid fried foods and increase fish intake to twice weekly. If you have not heard from [facility] by Friday, please let me know

## 2018-03-24 ENCOUNTER — Other Ambulatory Visit: Payer: Self-pay | Admitting: Internal Medicine

## 2018-03-24 DIAGNOSIS — N6320 Unspecified lump in the left breast, unspecified quadrant: Secondary | ICD-10-CM

## 2018-04-04 ENCOUNTER — Other Ambulatory Visit: Payer: Self-pay | Admitting: Internal Medicine

## 2018-04-04 ENCOUNTER — Ambulatory Visit
Admission: RE | Admit: 2018-04-04 | Discharge: 2018-04-04 | Disposition: A | Discharge status: Home / Self Care | Payer: BC Managed Care – PPO | Source: Ambulatory Visit | Attending: Internal Medicine | Admitting: Internal Medicine

## 2018-04-04 DIAGNOSIS — N6320 Unspecified lump in the left breast, unspecified quadrant: Secondary | ICD-10-CM

## 2018-04-08 ENCOUNTER — Ambulatory Visit
Admission: RE | Admit: 2018-04-08 | Discharge: 2018-04-08 | Disposition: A | Discharge status: Home / Self Care | Payer: BC Managed Care – PPO | Source: Ambulatory Visit | Attending: Internal Medicine | Admitting: Internal Medicine

## 2018-04-08 DIAGNOSIS — N6320 Unspecified lump in the left breast, unspecified quadrant: Secondary | ICD-10-CM

## 2018-05-04 HISTORY — PX: COLONOSCOPY: SHX174

## 2018-05-25 ENCOUNTER — Other Ambulatory Visit: Payer: Self-pay | Admitting: Nurse Practitioner

## 2018-07-20 ENCOUNTER — Encounter: Payer: Self-pay | Admitting: Internal Medicine

## 2018-09-20 ENCOUNTER — Encounter: Payer: Self-pay | Admitting: Internal Medicine

## 2018-09-20 ENCOUNTER — Other Ambulatory Visit: Payer: Self-pay

## 2018-09-20 ENCOUNTER — Ambulatory Visit: Payer: BC Managed Care – PPO | Admitting: Internal Medicine

## 2018-09-20 VITALS — BP 112/74 | HR 83 | Temp 98.2°F | Ht 66.5 in | Wt 213.2 lb

## 2018-09-20 DIAGNOSIS — E6609 Other obesity due to excess calories: Secondary | ICD-10-CM | POA: Diagnosis not present

## 2018-09-20 DIAGNOSIS — E039 Hypothyroidism, unspecified: Secondary | ICD-10-CM | POA: Diagnosis not present

## 2018-09-20 DIAGNOSIS — R7303 Prediabetes: Secondary | ICD-10-CM

## 2018-09-20 DIAGNOSIS — R350 Frequency of micturition: Secondary | ICD-10-CM

## 2018-09-20 DIAGNOSIS — Z6833 Body mass index (BMI) 33.0-33.9, adult: Secondary | ICD-10-CM

## 2018-09-20 LAB — POCT URINALYSIS DIPSTICK
Bilirubin, UA: NEGATIVE
Glucose, UA: NEGATIVE
Ketones, UA: NEGATIVE
Nitrite, UA: NEGATIVE
Protein, UA: NEGATIVE
Spec Grav, UA: 1.01 (ref 1.010–1.025)
Urobilinogen, UA: 0.2 E.U./dL
pH, UA: 6.5 (ref 5.0–8.0)

## 2018-09-20 NOTE — Patient Instructions (Signed)
Hypothyroidism  Hypothyroidism is when the thyroid gland does not make enough of certain hormones (it is underactive). The thyroid gland is a small gland located in the lower front part of the neck, just in front of the windpipe (trachea). This gland makes hormones that help control how the body uses food for energy (metabolism) as well as how the heart and brain function. These hormones also play a role in keeping your bones strong. When the thyroid is underactive, it produces too little of the hormones thyroxine (T4) and triiodothyronine (T3). What are the causes? This condition may be caused by:  Hashimoto's disease. This is a disease in which the body's disease-fighting system (immune system) attacks the thyroid gland. This is the most common cause.  Viral infections.  Pregnancy.  Certain medicines.  Birth defects.  Past radiation treatments to the head or neck for cancer.  Past treatment with radioactive iodine.  Past exposure to radiation in the environment.  Past surgical removal of part or all of the thyroid.  Problems with a gland in the center of the brain (pituitary gland).  Lack of enough iodine in the diet. What increases the risk? You are more likely to develop this condition if:  You are female.  You have a family history of thyroid conditions.  You use a medicine called lithium.  You take medicines that affect the immune system (immunosuppressants). What are the signs or symptoms? Symptoms of this condition include:  Feeling as though you have no energy (lethargy).  Not being able to tolerate cold.  Weight gain that is not explained by a change in diet or exercise habits.  Lack of appetite.  Dry skin.  Coarse hair.  Menstrual irregularity.  Slowing of thought processes.  Constipation.  Sadness or depression. How is this diagnosed? This condition may be diagnosed based on:  Your symptoms, your medical history, and a physical exam.  Blood  tests. You may also have imaging tests, such as an ultrasound or MRI. How is this treated? This condition is treated with medicine that replaces the thyroid hormones that your body does not make. After you begin treatment, it may take several weeks for symptoms to go away. Follow these instructions at home:  Take over-the-counter and prescription medicines only as told by your health care provider.  If you start taking any new medicines, tell your health care provider.  Keep all follow-up visits as told by your health care provider. This is important. ? As your condition improves, your dosage of thyroid hormone medicine may change. ? You will need to have blood tests regularly so that your health care provider can monitor your condition. Contact a health care provider if:  Your symptoms do not get better with treatment.  You are taking thyroid replacement medicine and you: ? Sweat a lot. ? Have tremors. ? Feel anxious. ? Lose weight rapidly. ? Cannot tolerate heat. ? Have emotional swings. ? Have diarrhea. ? Feel weak. Get help right away if you have:  Chest pain.  An irregular heartbeat.  A rapid heartbeat.  Difficulty breathing. Summary  Hypothyroidism is when the thyroid gland does not make enough of certain hormones (it is underactive).  When the thyroid is underactive, it produces too little of the hormones thyroxine (T4) and triiodothyronine (T3).  The most common cause is Hashimoto's disease, a disease in which the body's disease-fighting system (immune system) attacks the thyroid gland. The condition can also be caused by viral infections, medicine, pregnancy, or past   radiation treatment to the head or neck.  Symptoms may include weight gain, dry skin, constipation, feeling as though you do not have energy, and not being able to tolerate cold.  This condition is treated with medicine to replace the thyroid hormones that your body does not make. This information  is not intended to replace advice given to you by your health care provider. Make sure you discuss any questions you have with your health care provider. Document Released: 04/20/2005 Document Revised: 03/31/2017 Document Reviewed: 03/31/2017 Elsevier Interactive Patient Education  2019 Elsevier Inc.  

## 2018-09-20 NOTE — Addendum Note (Signed)
Addended by: Mariam Dollar on: 09/20/2018 05:00 PM   Modules accepted: Orders

## 2018-09-20 NOTE — Progress Notes (Signed)
Subjective:     Patient ID: Kara Fry , female    DOB: 06/26/1970 , 47 y.o.   MRN: [id number]   Chief Complaint  Patient presents with  . Hypothyroidism    HPI  She is here today for thyroid check. She reports compliance with meds. She takes Synthroid 100mcg daily, except 1/2 tab on Sundays. She has not had any issues with the medication.   Thyroid Problem  Presents for follow-up visit. Patient reports no cold intolerance, constipation, depressed mood, hoarse voice, weight gain or weight loss. The symptoms have been stable.     Past Medical History:  Diagnosis Date  . Hypothyroidism      Family History  Problem Relation Age of Onset  . Breast cancer Mother 40  . Breast cancer Sister 38  . Breast cancer Maternal Aunt   . Breast cancer Maternal Grandmother      Current Outpatient Medications:  .  SYNTHROID 100 MCG tablet, TAKE ONE TABLET MON-SAT AND 1/2 TAB ON SUN, Disp: 90 tablet, Rfl: 0   Allergies  Allergen Reactions  . Penicillins   . Shellfish Allergy      Review of Systems  Constitutional: Negative.  Negative for weight gain and weight loss.  HENT: Negative for hoarse voice.   Respiratory: Negative.   Cardiovascular: Negative.   Gastrointestinal: Negative.  Negative for constipation.  Endocrine: Negative for cold intolerance.  Neurological: Negative.   Psychiatric/Behavioral: Negative.      Today's Vitals   09/20/18 0843  BP: 112/74  Pulse: 83  Temp: 98.2 F (36.8 C)  TempSrc: Oral  Weight: 213 lb 3.2 oz (96.7 kg)  Height: 5' 6.5" (1.689 m)   Body mass index is 33.9 kg/m.   Objective:  Physical Exam Vitals signs and nursing note reviewed.  Constitutional:      Appearance: Normal appearance.  HENT:     Head: Normocephalic and atraumatic.  Cardiovascular:     Rate and Rhythm: Normal rate and regular rhythm.     Heart sounds: Normal heart sounds.  Pulmonary:     Effort: Pulmonary effort is normal.     Breath sounds: Normal breath  sounds.  Skin:    General: Skin is warm.  Neurological:     General: No focal deficit present.     Mental Status: She is alert.  Psychiatric:        Mood and Affect: Mood normal.        Behavior: Behavior normal.         Assessment And Plan:     1. Acquired hypothyroidism  I will check thyroid panel and adjust meds as needed.  - TSH - T4, Free  2. Prediabetes  HER A1C HAS BEEN ELEVATED IN THE PAST. I WILL CHECK AN A1C, BMET TODAY. SHE WAS ENCOURAGED TO AVOID SUGARY BEVERAGES AND PROCESSED FOODS INCLUDNG BREADS, RICE AND PASTA.  - Hemoglobin A1c - BMP8+EGFR  3. Class 1 obesity due to excess calories with serious comorbidity and body mass index (BMI) of 33.0 to 33.9 in adult  Importance of achieving optimal weight to decrease risk of cardiovascular disease and cancers was discussed with the patient in full detail. She is encouraged to start slowly - start with 10 minutes twice daily at least three to four days per week and to gradually build to 30 minutes five days weekly. She was given tips to incorporate more activity into her daily routine - take stairs when possible, park farther away from her job, grocery stores,   etc.    Kara N Sanders, MD    THE PATIENT IS ENCOURAGED TO PRACTICE SOCIAL DISTANCING DUE TO THE COVID-19 PANDEMIC.   

## 2018-09-21 LAB — BMP8+EGFR
BUN/Creatinine Ratio: 8 — ABNORMAL LOW (ref 9–23)
BUN: 8 mg/dL (ref 6–24)
CO2: 21 mmol/L (ref 20–29)
Calcium: 9.3 mg/dL (ref 8.7–10.2)
Chloride: 99 mmol/L (ref 96–106)
Creatinine, Ser: 0.99 mg/dL (ref 0.57–1.00)
GFR calc Af Amer: 77 mL/min/{1.73_m2} (ref >59)
GFR calc non Af Amer: 67 mL/min/{1.73_m2} (ref >59)
Glucose: 91 mg/dL (ref 65–99)
Potassium: 4.1 mmol/L (ref 3.5–5.2)
Sodium: 136 mmol/L (ref 134–144)

## 2018-09-21 LAB — TSH: TSH: 2.24 u[IU]/mL (ref 0.450–4.500)

## 2018-09-21 LAB — HEMOGLOBIN A1C
Est. average glucose Bld gHb Est-mCnc: 128 mg/dL
Hgb A1c MFr Bld: 6.1 % — ABNORMAL HIGH (ref 4.8–5.6)

## 2018-09-21 LAB — URINE CULTURE

## 2018-09-21 LAB — T4, FREE: Free T4: 1.68 ng/dL (ref 0.82–1.77)

## 2018-09-24 ENCOUNTER — Other Ambulatory Visit: Payer: Self-pay | Admitting: Nurse Practitioner

## 2019-03-13 ENCOUNTER — Other Ambulatory Visit: Payer: Self-pay | Admitting: Internal Medicine

## 2019-03-27 ENCOUNTER — Encounter: Payer: BC Managed Care – PPO | Admitting: Internal Medicine

## 2019-05-03 LAB — HM MAMMOGRAPHY

## 2019-05-03 LAB — HM COLONOSCOPY

## 2019-07-20 IMAGING — US ULTRASOUND LEFT BREAST LIMITED
1 series · 10 of 10 positions shown · non-contrast
Comparison: Previous exam(s).

CLINICAL DATA: Left breast mass seen on most recent screening
mammography.

EXAM:
DIGITAL DIAGNOSTIC LEFT MAMMOGRAM WITH CAD AND TOMO
ULTRASOUND LEFT BREAST

[Series 1: ultrasound left breast limited · 0.07mm/px · 10 of 10 slices shown]
[im 1/10]
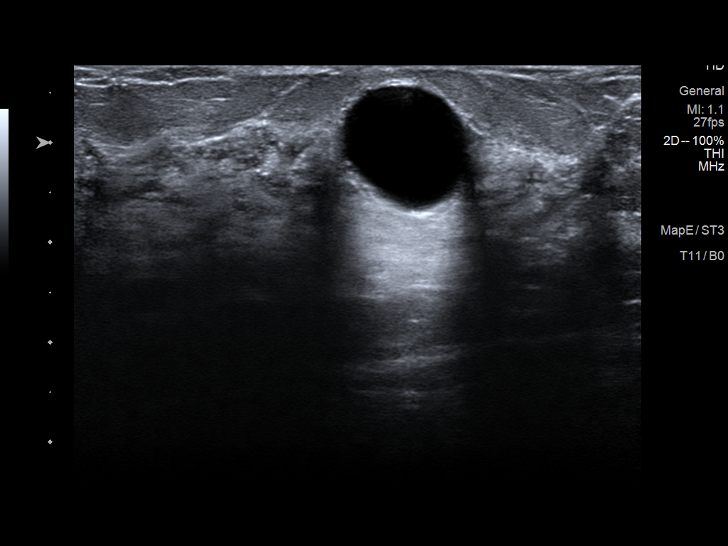
[im 2/10]
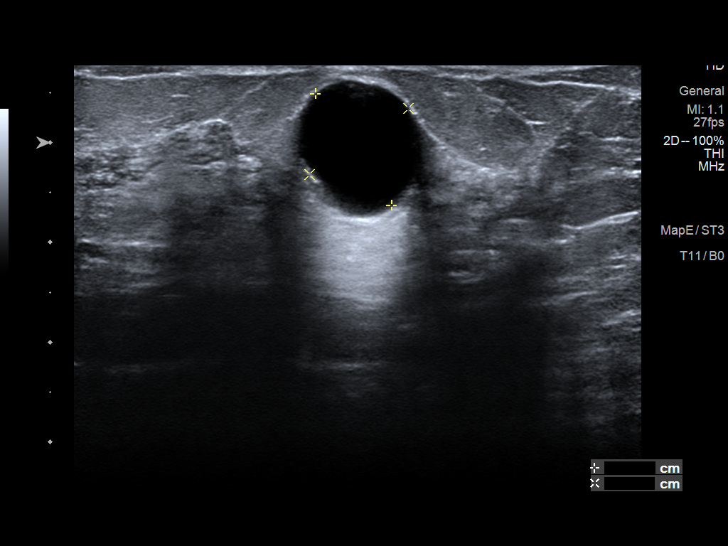
[im 3/10]
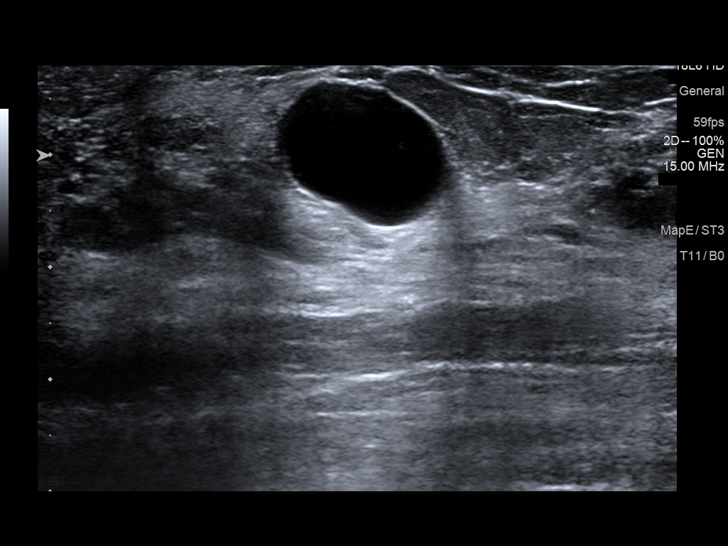
[im 4/10]
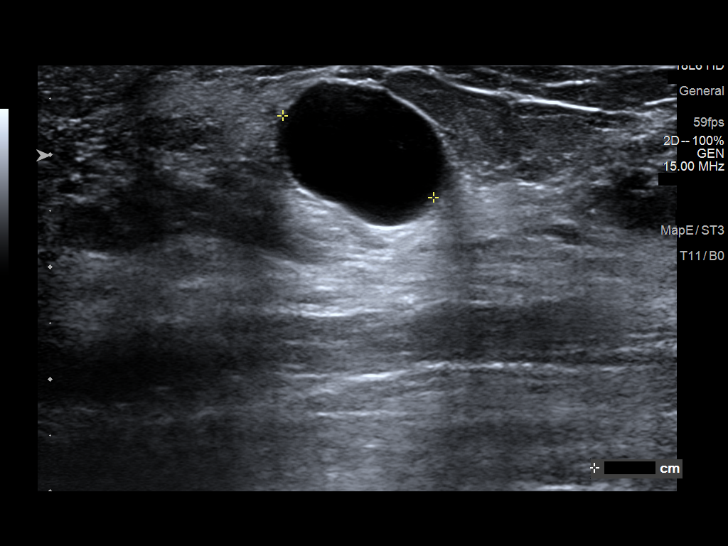
[im 5/10]
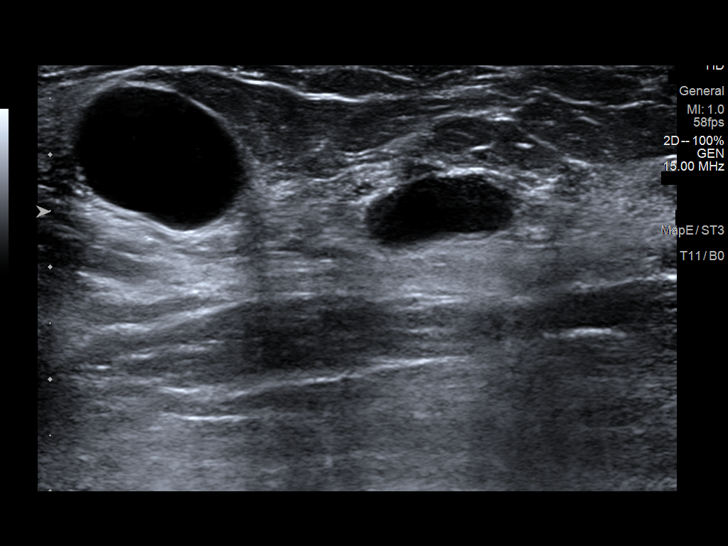
[im 6/10]
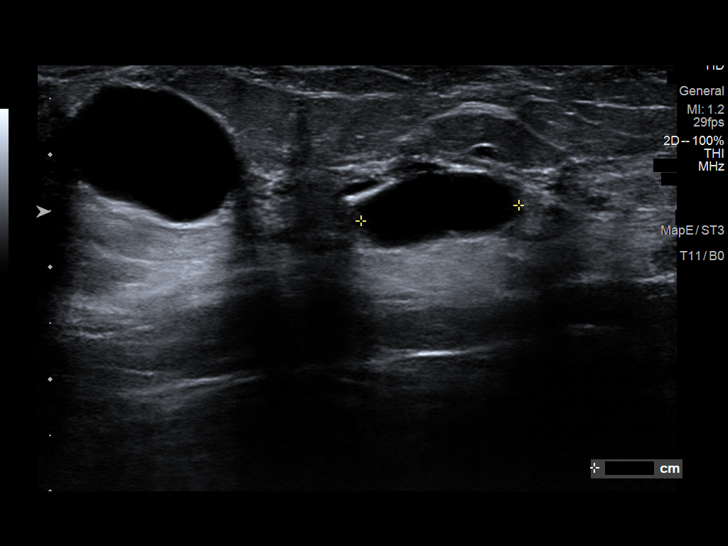
[im 7/10]
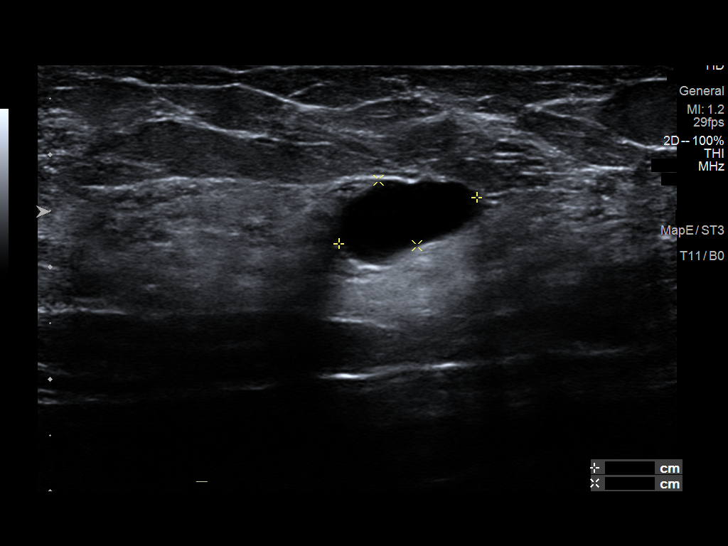
[im 8/10]
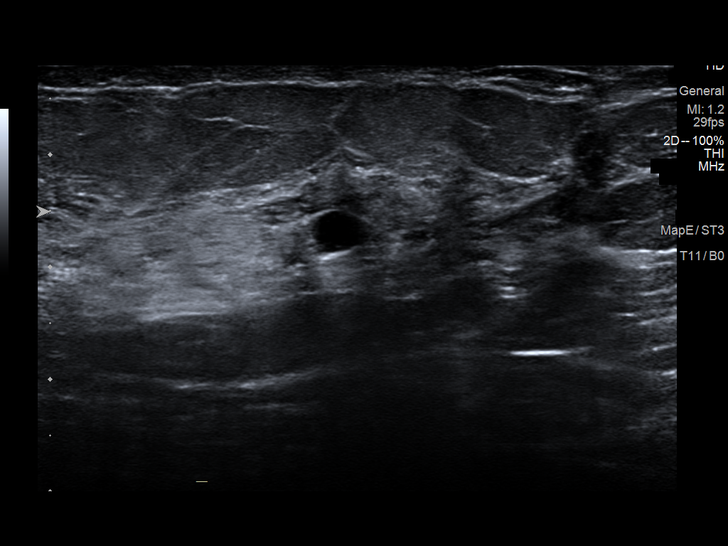
[im 9/10]
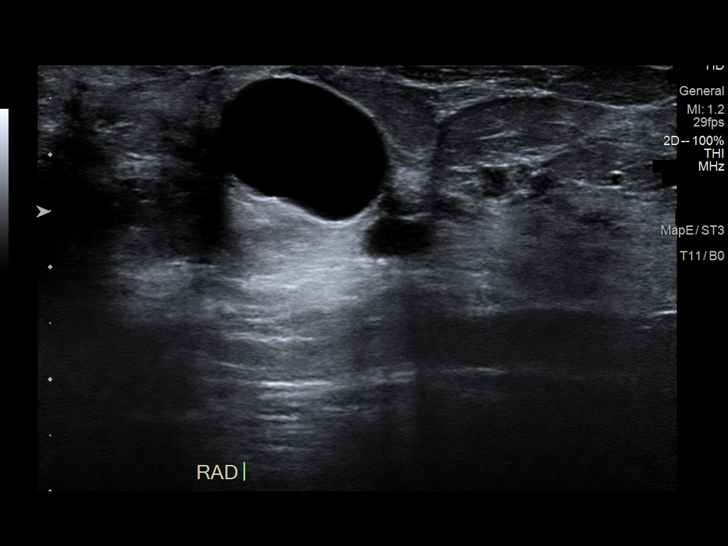
[im 10/10]
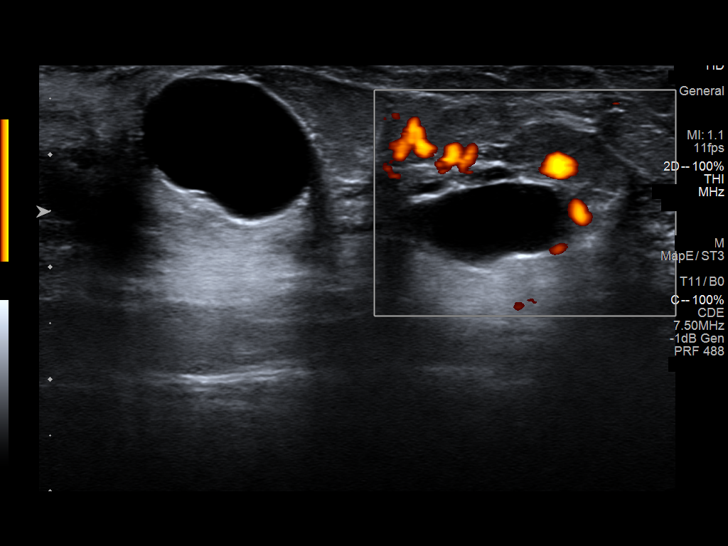

[10 of 10 positions shown; findings below may reference images not displayed]

ACR Breast Density Category c: The breast tissue is heterogeneously
dense, which may obscure small masses.
FINDINGS: Additional mammographic views of the left breast demonstrate
persistent circumscribed mass measuring 1.6 cm mammographically in
the left breast upper outer quadrant, anterior depth. Low-density
similar in appearance mass is seen the adjacent breast parenchyma.

Mammographic images were processed with CAD.

On physical exam, there is a palpable moderately firm approximately
1.5 cm mass in the left breast upper outer quadrant, anterior depth.

Targeted ultrasound is performed, showing a benign-appearing cyst in
the left breast 3 o'clock 1 cm from the nipple which measures 1.4 x
1.2 x 1.5 cm. This finding corresponds to the area of palpable
concern and the abnormality seen mammographically. Adjacent to it,
there is a second benign-appearing cyst measuring 1.4 x 1.3 x
cm.
IMPRESSION: Two benign-appearing cysts in the left breast.

No mammographic evidence of malignancy in the left breast.

The patient would like the cysts aspirated, given the palpable
nature of the abnormality and a strong family history of breast
cancer in her sister who was diagnosed with stage III breast cancer
in her 30s.

RECOMMENDATION:
Screening mammogram in one year.(Code:QK-Z-54J)

I have discussed the findings and recommendations with the patient.
Results were also provided in writing at the conclusion of the
visit. If applicable, a reminder letter will be sent to the patient
regarding the next appointment.

BI-RADS CATEGORY  2: Benign.

## 2019-07-24 IMAGING — US ULTRASOUND GUIDED BREAST CYST ASPIRATION
1 series · 4 of 4 positions shown · non-contrast
Comparison: Previous exams.

CLINICAL DATA: 47-year-old female presenting for ultrasound-guided
aspiration of 2 left breast cysts.

EXAM:
ULTRASOUND GUIDED LEFT BREAST CYST ASPIRATION

[Series 1: ultrasound guided breast cyst aspiration · 0.07mm/px · 4 of 4 slices shown]
[im 1/4]
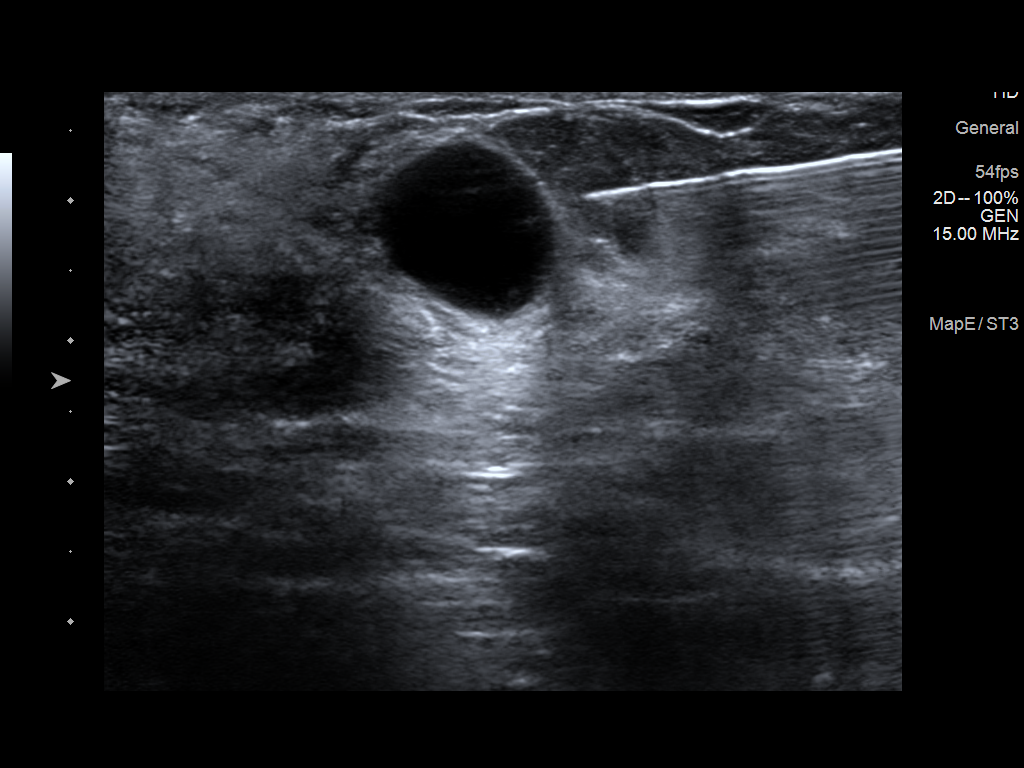
[im 2/4]
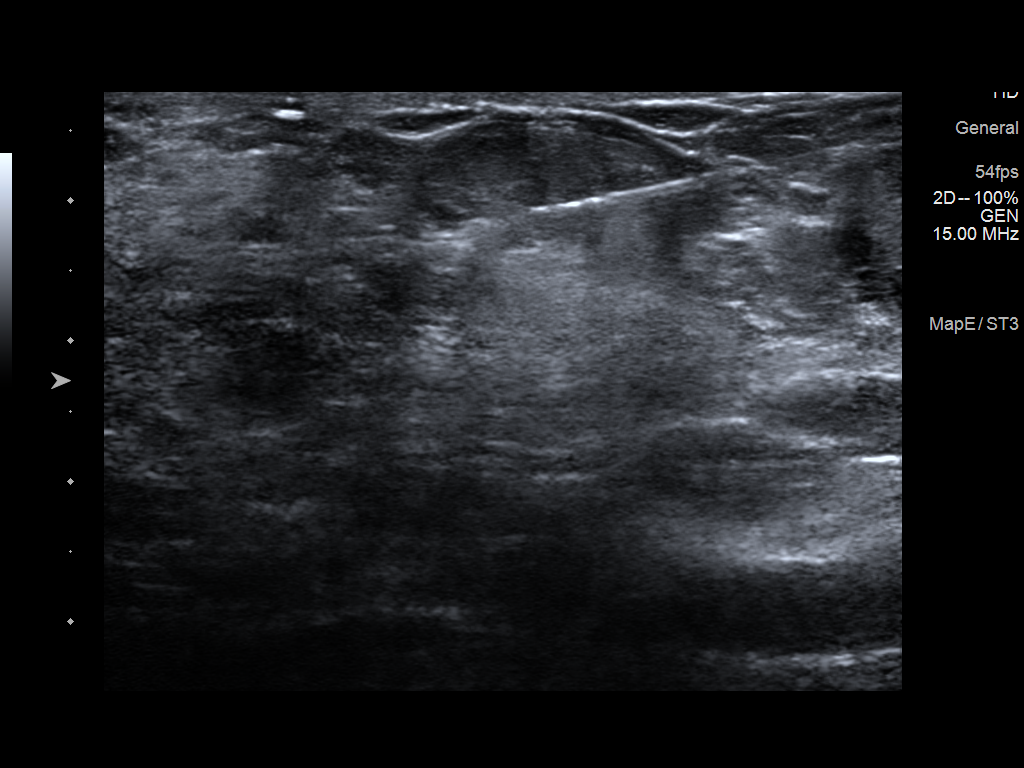
[im 3/4]
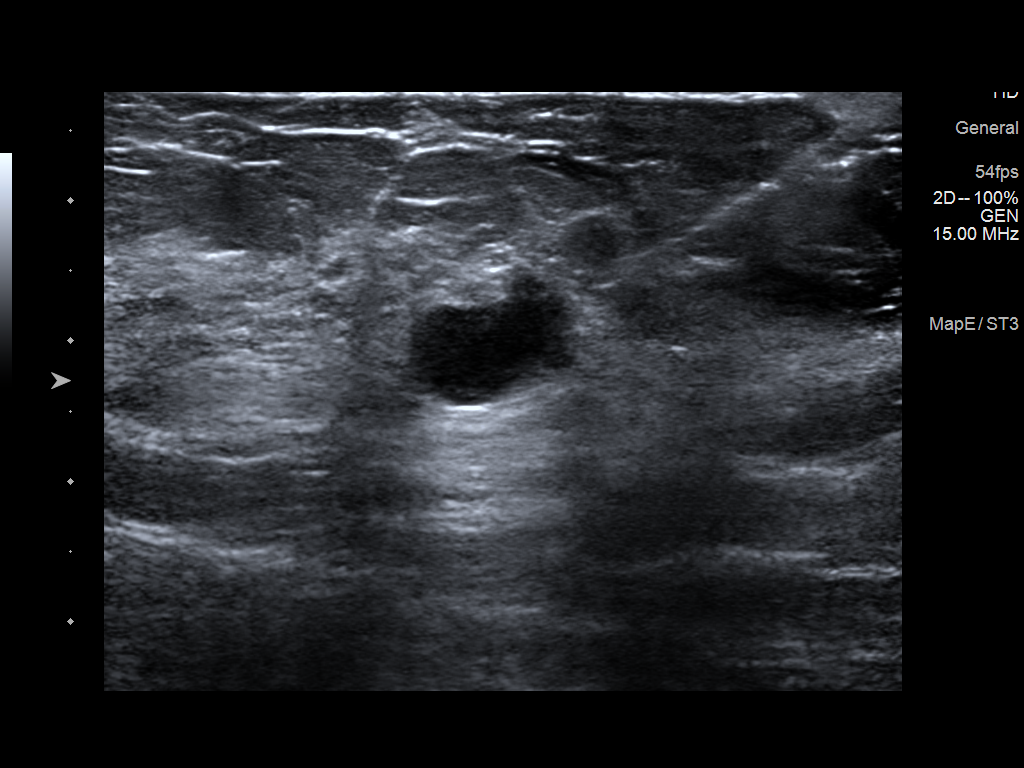
[im 4/4]
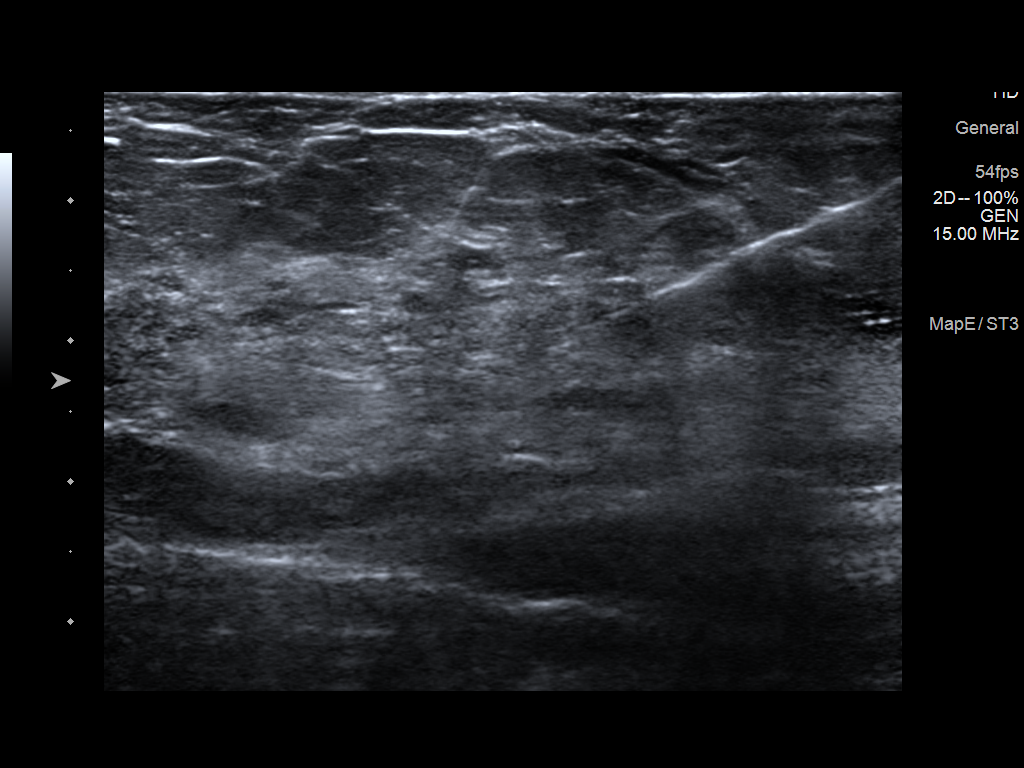

[4 of 4 positions shown; findings below may reference images not displayed]

PROCEDURE:
Using sterile technique, 1% lidocaine, under direct ultrasound
visualization, needle aspiration of 2 cysts in the lateral left
breast at 3 o'clock was performed. The cysts aspirated to complete
resolution and the fluid was discarded.
IMPRESSION: Ultrasound-guided aspiration of 2 cysts in the left breast at 3
o'clock no apparent complications.

RECOMMENDATIONS:
1. Return to routine screening mammography is recommended. The
patient will be due for screening in Thursday January, 2019.

2. Genetics counseling/testing is recommended if not already
performed to determine the patient's lifetime risk of breast cancer
given her strong family history. Per [employer]
guidelines, if the patient has a calculated lifetime risk of
developing breast cancer of greater than 20%, annual screening MRI
of the breasts would be recommended at the time of screening
mammography..

## 2019-09-13 ENCOUNTER — Other Ambulatory Visit: Payer: Self-pay | Admitting: Internal Medicine

## 2020-03-13 ENCOUNTER — Other Ambulatory Visit: Payer: Self-pay

## 2020-03-13 ENCOUNTER — Other Ambulatory Visit: Payer: Self-pay | Admitting: Internal Medicine

## 2020-03-13 MED ORDER — LEVOTHYROXINE SODIUM 100 MCG PO TABS: ORAL_TABLET | ORAL | 0 refills | Status: DC

## 2020-04-03 LAB — CBC AND DIFFERENTIAL
HCT: 39 (ref 36–46)
Hemoglobin: 12.6 (ref 12.0–16.0)
Platelets: 398 (ref 150–399)
WBC: 6.8

## 2020-04-03 LAB — LIPID PANEL
Cholesterol: 213 — AB (ref 0–200)
HDL: 51 (ref 35–70)
LDL Cholesterol: 149
Triglycerides: 72 (ref 40–160)

## 2020-04-03 LAB — HM PAP SMEAR

## 2020-04-03 LAB — CBC: RBC: 4.56 (ref 3.87–5.11)

## 2020-04-03 LAB — VITAMIN D 25 HYDROXY (VIT D DEFICIENCY, FRACTURES): Vit D, 25-Hydroxy: 17.8

## 2020-04-03 LAB — HM MAMMOGRAPHY

## 2020-04-06 ENCOUNTER — Other Ambulatory Visit: Payer: Self-pay | Admitting: Internal Medicine

## 2020-04-10 ENCOUNTER — Other Ambulatory Visit: Payer: Self-pay

## 2020-04-10 ENCOUNTER — Ambulatory Visit: Payer: BC Managed Care – PPO | Admitting: Nurse Practitioner

## 2020-04-10 ENCOUNTER — Encounter: Payer: Self-pay | Admitting: Nurse Practitioner

## 2020-04-10 VITALS — BP 130/78 | HR 63 | Temp 98.1°F | Ht 66.5 in | Wt 220.0 lb

## 2020-04-10 DIAGNOSIS — E039 Hypothyroidism, unspecified: Secondary | ICD-10-CM | POA: Diagnosis not present

## 2020-04-10 DIAGNOSIS — R7303 Prediabetes: Secondary | ICD-10-CM | POA: Diagnosis not present

## 2020-04-10 NOTE — Patient Instructions (Signed)
Hypothyroidism  Hypothyroidism is when the thyroid gland does not make enough of certain hormones (it is underactive). The thyroid gland is a small gland located in the lower front part of the neck, just in front of the windpipe (trachea). This gland makes hormones that help control how the body uses food for energy (metabolism) as well as how the heart and brain function. These hormones also play a role in keeping your bones strong. When the thyroid is underactive, it produces too little of the hormones thyroxine (T4) and triiodothyronine (T3). What are the causes? This condition may be caused by:  Hashimoto's disease. This is a disease in which the body's disease-fighting system (immune system) attacks the thyroid gland. This is the most common cause.  Viral infections.  Pregnancy.  Certain medicines.  Birth defects.  Past radiation treatments to the head or neck for cancer.  Past treatment with radioactive iodine.  Past exposure to radiation in the environment.  Past surgical removal of part or all of the thyroid.  Problems with a gland in the center of the brain (pituitary gland).  Lack of enough iodine in the diet. What increases the risk? You are more likely to develop this condition if:  You are female.  You have a family history of thyroid conditions.  You use a medicine called lithium.  You take medicines that affect the immune system (immunosuppressants). What are the signs or symptoms? Symptoms of this condition include:  Feeling as though you have no energy (lethargy).  Not being able to tolerate cold.  Weight gain that is not explained by a change in diet or exercise habits.  Lack of appetite.  Dry skin.  Coarse hair.  Menstrual irregularity.  Slowing of thought processes.  Constipation.  Sadness or depression. How is this diagnosed? This condition may be diagnosed based on:  Your symptoms, your medical history, and a physical exam.  Blood  tests. You may also have imaging tests, such as an ultrasound or MRI. How is this treated? This condition is treated with medicine that replaces the thyroid hormones that your body does not make. After you begin treatment, it may take several weeks for symptoms to go away. Follow these instructions at home:  Take over-the-counter and prescription medicines only as told by your health care provider.  If you start taking any new medicines, tell your health care provider.  Keep all follow-up visits as told by your health care provider. This is important. ? As your condition improves, your dosage of thyroid hormone medicine may change. ? You will need to have blood tests regularly so that your health care provider can monitor your condition. Contact a health care provider if:  Your symptoms do not get better with treatment.  You are taking thyroid replacement medicine and you: ? Sweat a lot. ? Have tremors. ? Feel anxious. ? Lose weight rapidly. ? Cannot tolerate heat. ? Have emotional swings. ? Have diarrhea. ? Feel weak. Get help right away if you have:  Chest pain.  An irregular heartbeat.  A rapid heartbeat.  Difficulty breathing. Summary  Hypothyroidism is when the thyroid gland does not make enough of certain hormones (it is underactive).  When the thyroid is underactive, it produces too little of the hormones thyroxine (T4) and triiodothyronine (T3).  The most common cause is Hashimoto's disease, a disease in which the body's disease-fighting system (immune system) attacks the thyroid gland. The condition can also be caused by viral infections, medicine, pregnancy, or past   radiation treatment to the head or neck.  Symptoms may include weight gain, dry skin, constipation, feeling as though you do not have energy, and not being able to tolerate cold.  This condition is treated with medicine to replace the thyroid hormones that your body does not make. This information  is not intended to replace advice given to you by your health care provider. Make sure you discuss any questions you have with your health care provider. Document Revised: 04/02/2017 Document Reviewed: 03/31/2017 Elsevier Patient Education  2020 Elsevier Inc.  

## 2020-04-10 NOTE — Progress Notes (Signed)
I,Yamilka Roman [employer] as a Neurosurgeon for [employer], FNP.,have documented all relevant documentation on the behalf of Arnette Felts, FNP,as directed by  Arnette Felts, FNP while in the presence of Arnette Felts, FNP. This visit occurred during the SARS-CoV-2 public health emergency.  Safety protocols were in place, including screening questions prior to the visit, additional usage of staff PPE, and extensive cleaning of exam room while observing appropriate contact time as indicated for disinfecting solutions.  Subjective:     Patient ID: Kara Fry , female    DOB: [birth year] , 49 y.o.   MRN: [id number]   Chief Complaint  Patient presents with  . Hypothyroidism    HPI  She is here today for thyroid check. She had her labs done one week ago by Dr. Osborn Coho -   Wt Readings from Last 3 Encounters: 04/10/20 : 220 lb (99.8 kg) 09/20/18 : 213 lb 3.2 oz (96.7 kg) 03/21/18 : 211 lb (95.7 kg)  She has been working at home for the last 18 months. She is now back in the office since September.     Thyroid Problem Presents for follow-up visit. Symptoms include fatigue and weight gain. Patient reports no cold intolerance, constipation, depressed mood, hoarse voice or weight loss. The symptoms have been stable.     Past Medical History:  Diagnosis Date  . Hypothyroidism      Family History  Problem Relation Age of Onset  . Breast cancer Mother 59  . Breast cancer Sister 37  . Breast cancer Maternal Aunt   . Breast cancer Maternal Grandmother      Current Outpatient Medications:  .  levothyroxine (SYNTHROID) 100 MCG tablet, TAKE 1 TABLET BY MOUTH DAILY MONDAY THROUGH SATURDAY AND 1/2 TABLET ON SUNDAY, Disp: 28 tablet, Rfl: 0   Allergies  Allergen Reactions  . Penicillins   . Shellfish Allergy      Review of Systems  Constitutional: Positive for fatigue and weight gain. Negative for weight loss.  HENT: Negative for hoarse voice.   Respiratory: Negative.    Cardiovascular: Negative.   Gastrointestinal: Negative for constipation.  Endocrine: Negative for cold intolerance.  Neurological: Negative.   Psychiatric/Behavioral: Negative.      Today's Vitals   04/10/20 1150  BP: 130/78  Pulse: 63  Temp: 98.1 F (36.7 C)  Weight: 220 lb (99.8 kg)  Height: 5' 6.5" (1.689 m)  PainSc: 0-No pain   Body mass index is 34.98 kg/m.   Objective:  Physical Exam Vitals reviewed.  Constitutional:      General: She is not in acute distress.    Appearance: Normal appearance. She is obese.  Cardiovascular:     Pulses: Normal pulses.     Heart sounds: Normal heart sounds. No murmur heard.   Pulmonary:     Effort: Pulmonary effort is normal. No respiratory distress.     Breath sounds: Normal breath sounds.  Skin:    General: Skin is warm and dry.     Capillary Refill: Capillary refill takes less than 2 seconds.  Neurological:     General: No focal deficit present.     Mental Status: She is alert and oriented to person, place, and time.     Cranial Nerves: No cranial nerve deficit.  Psychiatric:        Mood and Affect: Mood normal.        Behavior: Behavior normal.        Thought Content: Thought content normal.  Judgment: Judgment normal.         Assessment And Plan:     1. Acquired hypothyroidism  Chronic, has not been seen in over 6 months  She reports she had her thyroid levels checked at her gyn will request records.  2. Prediabetes  Chronic, will check her HgbA1c if done at GYN  Encouraged to avoid sugary foods and drinks     Patient was given opportunity to ask questions. Patient verbalized understanding of the plan and was able to repeat key elements of the plan. All questions were answered to their satisfaction.   Jeanell Sparrow, FNP, have reviewed all documentation for this visit. The documentation on 04/17/20 for the exam, diagnosis, procedures, and orders are all accurate and complete.   THE PATIENT IS  ENCOURAGED TO PRACTICE SOCIAL DISTANCING DUE TO THE COVID-19 PANDEMIC.

## 2020-04-16 ENCOUNTER — Telehealth: Payer: Self-pay

## 2020-04-16 NOTE — Telephone Encounter (Signed)
I left the pt a message that Kara Constant, FNP-BC or Dr/ Allyne Gee hasn't received the lab results from Dr. Su Hilt office that I called and left a message requesting the records and if the pt could give them a call also.

## 2020-04-17 ENCOUNTER — Encounter: Payer: Self-pay | Admitting: Nurse Practitioner

## 2020-04-18 ENCOUNTER — Other Ambulatory Visit: Payer: Self-pay

## 2020-04-18 ENCOUNTER — Other Ambulatory Visit: Payer: Self-pay | Admitting: Nurse Practitioner

## 2020-04-18 DIAGNOSIS — E039 Hypothyroidism, unspecified: Secondary | ICD-10-CM

## 2020-04-18 MED ORDER — LEVOTHYROXINE SODIUM 100 MCG PO TABS: ORAL_TABLET | ORAL | 1 refills | Status: DC

## 2020-05-16 ENCOUNTER — Other Ambulatory Visit: Payer: BC Managed Care – PPO

## 2020-05-16 ENCOUNTER — Other Ambulatory Visit: Payer: Self-pay

## 2020-05-16 DIAGNOSIS — E039 Hypothyroidism, unspecified: Secondary | ICD-10-CM

## 2020-05-17 ENCOUNTER — Other Ambulatory Visit: Payer: Self-pay

## 2020-05-17 DIAGNOSIS — E039 Hypothyroidism, unspecified: Secondary | ICD-10-CM

## 2020-05-17 LAB — TSH: TSH: 12.4 u[IU]/mL — ABNORMAL HIGH (ref 0.450–4.500)

## 2020-05-17 LAB — T3, FREE: T3, Free: 2 pg/mL (ref 2.0–4.4)

## 2020-05-17 LAB — T4: T4, Total: 8.8 ug/dL (ref 4.5–12.0)

## 2020-06-13 ENCOUNTER — Other Ambulatory Visit: Payer: BC Managed Care – PPO

## 2020-06-13 ENCOUNTER — Telehealth: Payer: Self-pay

## 2020-06-13 ENCOUNTER — Other Ambulatory Visit: Payer: Self-pay

## 2020-06-13 DIAGNOSIS — E039 Hypothyroidism, unspecified: Secondary | ICD-10-CM

## 2020-06-13 NOTE — Telephone Encounter (Signed)
Pt came in for a lab visit (blood work), pt demanded the lab tech run her urine the lab tech instructed her that she can not just run her urine she would have to talk to the provider. Per JM ask pt is she having symptoms if she is then she would need an appt.. Per pt she is having frequency but did not want to wait for an appt, the providers should already know that she has recurrent UTI's. She stated before leaving she will have someone run her urine at her job, she is not staying for no appt,  Opened the door stated  this is ridiculous and the providers are ridiculous and to make sure I told them that!

## 2020-06-14 LAB — T4: T4, Total: 8.2 ug/dL (ref 4.5–12.0)

## 2020-06-14 LAB — T3, FREE: T3, Free: 2 pg/mL (ref 2.0–4.4)

## 2020-06-14 LAB — TSH: TSH: 8.81 u[IU]/mL — ABNORMAL HIGH (ref 0.450–4.500)

## 2020-06-16 ENCOUNTER — Other Ambulatory Visit: Payer: Self-pay | Admitting: Nurse Practitioner

## 2020-06-16 DIAGNOSIS — E039 Hypothyroidism, unspecified: Secondary | ICD-10-CM

## 2020-06-16 MED ORDER — LEVOTHYROXINE SODIUM 100 MCG PO TABS: ORAL_TABLET | ORAL | 1 refills | Status: DC

## 2020-07-09 ENCOUNTER — Encounter: Payer: Self-pay | Admitting: Internal Medicine

## 2020-07-09 ENCOUNTER — Other Ambulatory Visit: Payer: Self-pay | Admitting: Internal Medicine

## 2020-07-09 ENCOUNTER — Ambulatory Visit: Payer: BC Managed Care – PPO | Admitting: Internal Medicine

## 2020-07-09 ENCOUNTER — Other Ambulatory Visit: Payer: Self-pay

## 2020-07-09 VITALS — BP 120/74 | HR 84 | Temp 97.7°F | Ht 66.5 in | Wt 218.8 lb

## 2020-07-09 DIAGNOSIS — N951 Menopausal and female climacteric states: Secondary | ICD-10-CM | POA: Diagnosis not present

## 2020-07-09 DIAGNOSIS — E039 Hypothyroidism, unspecified: Secondary | ICD-10-CM

## 2020-07-09 DIAGNOSIS — Z1159 Encounter for screening for other viral diseases: Secondary | ICD-10-CM

## 2020-07-09 DIAGNOSIS — Z6834 Body mass index (BMI) 34.0-34.9, adult: Secondary | ICD-10-CM

## 2020-07-09 DIAGNOSIS — E6609 Other obesity due to excess calories: Secondary | ICD-10-CM | POA: Diagnosis not present

## 2020-07-09 DIAGNOSIS — N390 Urinary tract infection, site not specified: Secondary | ICD-10-CM | POA: Diagnosis not present

## 2020-07-09 LAB — POCT URINALYSIS DIPSTICK
Bilirubin, UA: NEGATIVE
Blood, UA: NEGATIVE
Glucose, UA: NEGATIVE
Ketones, UA: NEGATIVE
Nitrite, UA: NEGATIVE
Protein, UA: NEGATIVE
Spec Grav, UA: 1.015 (ref 1.010–1.025)
Urobilinogen, UA: 0.2 E.U./dL
pH, UA: 6.5 (ref 5.0–8.0)

## 2020-07-09 MED ORDER — SULFAMETHOXAZOLE-TRIMETHOPRIM 800-160 MG PO TABS: 1.0000 | ORAL_TABLET | ORAL | 0 refills | Status: DC

## 2020-07-09 NOTE — Patient Instructions (Signed)
Hypothyroidism  Hypothyroidism is when the thyroid gland does not make enough of certain hormones (it is underactive). The thyroid gland is a small gland located in the lower front part of the neck, just in front of the windpipe (trachea). This gland makes hormones that help control how the body uses food for energy (metabolism) as well as how the heart and brain function. These hormones also play a role in keeping your bones strong. When the thyroid is underactive, it produces too little of the hormones thyroxine (T4) and triiodothyronine (T3). What are the causes? This condition may be caused by:  Hashimoto's disease. This is a disease in which the body's disease-fighting system (immune system) attacks the thyroid gland. This is the most common cause.  Viral infections.  Pregnancy.  Certain medicines.  Birth defects.  Past radiation treatments to the head or neck for cancer.  Past treatment with radioactive iodine.  Past exposure to radiation in the environment.  Past surgical removal of part or all of the thyroid.  Problems with a gland in the center of the brain (pituitary gland).  Lack of enough iodine in the diet. What increases the risk? You are more likely to develop this condition if:  You are female.  You have a family history of thyroid conditions.  You use a medicine called lithium.  You take medicines that affect the immune system (immunosuppressants). What are the signs or symptoms? Symptoms of this condition include:  Feeling as though you have no energy (lethargy).  Not being able to tolerate cold.  Weight gain that is not explained by a change in diet or exercise habits.  Lack of appetite.  Dry skin.  Coarse hair.  Menstrual irregularity.  Slowing of thought processes.  Constipation.  Sadness or depression. How is this diagnosed? This condition may be diagnosed based on:  Your symptoms, your medical history, and a physical exam.  Blood  tests. You may also have imaging tests, such as an ultrasound or MRI. How is this treated? This condition is treated with medicine that replaces the thyroid hormones that your body does not make. After you begin treatment, it may take several weeks for symptoms to go away. Follow these instructions at home:  Take over-the-counter and prescription medicines only as told by your health care provider.  If you start taking any new medicines, tell your health care provider.  Keep all follow-up visits as told by your health care provider. This is important. ? As your condition improves, your dosage of thyroid hormone medicine may change. ? You will need to have blood tests regularly so that your health care provider can monitor your condition. Contact a health care provider if:  Your symptoms do not get better with treatment.  You are taking thyroid hormone replacement medicine and you: ? Sweat a lot. ? Have tremors. ? Feel anxious. ? Lose weight rapidly. ? Cannot tolerate heat. ? Have emotional swings. ? Have diarrhea. ? Feel weak. Get help right away if you have:  Chest pain.  An irregular heartbeat.  A rapid heartbeat.  Difficulty breathing. Summary  Hypothyroidism is when the thyroid gland does not make enough of certain hormones (it is underactive).  When the thyroid is underactive, it produces too little of the hormones thyroxine (T4) and triiodothyronine (T3).  The most common cause is Hashimoto's disease, a disease in which the body's disease-fighting system (immune system) attacks the thyroid gland. The condition can also be caused by viral infections, medicine, pregnancy, or   past radiation treatment to the head or neck.  Symptoms may include weight gain, dry skin, constipation, feeling as though you do not have energy, and not being able to tolerate cold.  This condition is treated with medicine to replace the thyroid hormones that your body does not make. This  information is not intended to replace advice given to you by your health care provider. Make sure you discuss any questions you have with your health care provider. Document Revised: 01/19/2020 Document Reviewed: 01/04/2020 Elsevier Patient Education  2021 Elsevier Inc.  

## 2020-07-09 NOTE — Progress Notes (Signed)
I,Kara Fry,acting as a Neurosurgeon for Kara Aliment, MD.,have documented all relevant documentation on the behalf of Kara Aliment, MD,as directed by  Kara Aliment, MD while in the presence of Kara Aliment, MD.  This visit occurred during the SARS-CoV-2 public health emergency.  Safety protocols were in place, including screening questions prior to the visit, additional usage of staff PPE, and extensive cleaning of exam room while observing appropriate contact time as indicated for disinfecting solutions.  Subjective:     Patient ID: Kara Fry , female    DOB: February 06, 1971 , 50 y.o.   MRN: [id number]   Chief Complaint  Patient presents with  . Hypothyroidism    HPI  The patient is here today for a thyroid f/u. She is currently taking Synthroid daily. Dose recently adjusted by Kara Fry due to elevated TSH. She does admit to feeling sluggish.   Thyroid Problem Presents for follow-up visit. Symptoms include fatigue and weight gain. Patient reports no cold intolerance, constipation, depressed mood, hoarse voice or weight loss. The symptoms have been stable.     Past Medical History:  Diagnosis Date  . Hypothyroidism      Family History  Problem Relation Age of Onset  . Breast cancer Mother 37  . Breast cancer Sister 33  . Breast cancer Maternal Aunt   . Breast cancer Maternal Grandmother      Current Outpatient Medications:  .  sulfamethoxazole-trimethoprim (BACTRIM DS) 800-160 MG tablet, Take 1 tablet by mouth 3 (three) times a week., Disp: 30 tablet, Rfl: 0 .  levothyroxine (SYNTHROID) 100 MCG tablet, TAKE 1 TABLET BY MOUTH DAILY, Disp: 90 tablet, Rfl: 1   Allergies  Allergen Reactions  . Penicillins   . Shellfish Allergy      Review of Systems  Constitutional: Positive for fatigue and weight gain. Negative for weight loss.  HENT: Negative.  Negative for hoarse voice.   Eyes: Negative.   Respiratory: Negative.   Cardiovascular: Negative.    Gastrointestinal: Negative.  Negative for constipation.  Endocrine: Negative.  Negative for cold intolerance.  Genitourinary: Negative.        She reports h/o recurrent uti. Would like to get refill of Bactrim DS. Has used in past for prophylaxis.   Skin: Negative.   Psychiatric/Behavioral: Negative.   All other systems reviewed and are negative.    Today's Vitals   07/09/20 1054  BP: 120/74  Pulse: 84  Temp: 97.7 F (36.5 C)  TempSrc: Oral  Weight: 218 lb 12.8 oz (99.2 kg)  Height: 5' 6.5" (1.689 m)   Body mass index is 34.79 kg/m.  Wt Readings from Last 3 Encounters:  07/09/20 218 lb 12.8 oz (99.2 kg)  04/10/20 220 lb (99.8 kg)  09/20/18 213 lb 3.2 oz (96.7 kg)   Objective:  Physical Exam Vitals and nursing note reviewed.  Constitutional:      Appearance: Normal appearance. She is obese.  HENT:     Head: Normocephalic and atraumatic.     Nose:     Comments: Masked     Mouth/Throat:     Comments: Masked  Cardiovascular:     Rate and Rhythm: Normal rate and regular rhythm.     Heart sounds: Normal heart sounds.  Pulmonary:     Effort: Pulmonary effort is normal.     Breath sounds: Normal breath sounds.  Musculoskeletal:     Cervical back: Normal range of motion.  Skin:    General: Skin is warm.  Neurological:  General: No focal deficit present.     Mental Status: She is alert and oriented to person, place, and time.         Assessment And Plan:     1. Acquired hypothyroidism Comments: I will check TSh today.  I will adjust meds as needed. She will rto in May 2022 for her physical examination.  - TSH  2. Perimenopausal Comments: Encouraged to follow clean eating plan. May need to consider OTC Remifemin or Estroven if her sx worsen.   3. Recurrent UTI Comments: I will check urinalysis today. She was also given rx Bactrim DS to use daily prn as prophylactic therapy.  - POCT Urinalysis Dipstick (81002) - Urine Culture  4. Class 1 obesity due to  excess calories with serious comorbidity and body mass index (BMI) of 34.0 to 34.9 in adult Comments: She is encouraged to strive for BMI less than 30 to decrease cardiac risk.   5. Encounter for HCV screening test for low risk patient Comments: I will check HCV today.  - Hepatitis C antibody   Patient was given opportunity to ask questions. Patient verbalized understanding of the plan and was able to repeat key elements of the plan. All questions were answered to their satisfaction.   I, Kara Aliment, MD, have reviewed all documentation for this visit. The documentation on 07/10/20 for the exam, diagnosis, procedures, and orders are all accurate and complete.  THE PATIENT IS ENCOURAGED TO PRACTICE SOCIAL DISTANCING DUE TO THE COVID-19 PANDEMIC.

## 2020-07-10 LAB — TSH: TSH: 3.57 u[IU]/mL (ref 0.450–4.500)

## 2020-07-10 LAB — HEPATITIS C ANTIBODY: Hep C Virus Ab: <0.1 s/co ratio (ref 0.0–0.9)

## 2020-07-11 LAB — URINE CULTURE

## 2020-07-12 ENCOUNTER — Telehealth: Payer: Self-pay

## 2020-07-12 NOTE — Telephone Encounter (Signed)
PT NOTIFIED  

## 2020-07-12 NOTE — Telephone Encounter (Signed)
-----   Message from Dorothyann Peng, MD sent at 07/11/2020  4:24 PM EST ----- Urine culture is negative. Have a great weekend!

## 2020-09-10 ENCOUNTER — Encounter: Payer: Self-pay | Admitting: Nurse Practitioner

## 2020-11-15 ENCOUNTER — Other Ambulatory Visit: Payer: Self-pay | Admitting: Nurse Practitioner

## 2020-11-15 DIAGNOSIS — E039 Hypothyroidism, unspecified: Secondary | ICD-10-CM

## 2021-01-29 ENCOUNTER — Other Ambulatory Visit: Payer: Self-pay

## 2021-01-29 ENCOUNTER — Ambulatory Visit (INDEPENDENT_AMBULATORY_CARE_PROVIDER_SITE_OTHER): Payer: BC Managed Care – PPO | Admitting: Nurse Practitioner

## 2021-01-29 ENCOUNTER — Encounter: Payer: Self-pay | Admitting: Nurse Practitioner

## 2021-01-29 VITALS — BP 112/70 | HR 85 | Temp 97.9°F | Ht 66.5 in | Wt 215.4 lb

## 2021-01-29 DIAGNOSIS — E039 Hypothyroidism, unspecified: Secondary | ICD-10-CM | POA: Diagnosis not present

## 2021-01-29 DIAGNOSIS — Z0000 Encounter for general adult medical examination without abnormal findings: Secondary | ICD-10-CM | POA: Diagnosis not present

## 2021-01-29 DIAGNOSIS — R7303 Prediabetes: Secondary | ICD-10-CM | POA: Diagnosis not present

## 2021-01-29 DIAGNOSIS — E559 Vitamin D deficiency, unspecified: Secondary | ICD-10-CM | POA: Diagnosis not present

## 2021-01-29 DIAGNOSIS — Z6834 Body mass index (BMI) 34.0-34.9, adult: Secondary | ICD-10-CM

## 2021-01-29 DIAGNOSIS — Z2821 Immunization not carried out because of patient refusal: Secondary | ICD-10-CM

## 2021-01-29 DIAGNOSIS — E6609 Other obesity due to excess calories: Secondary | ICD-10-CM

## 2021-01-29 NOTE — Patient Instructions (Signed)
Health Maintenance, Female Adopting a healthy lifestyle and getting preventive care are important in promoting health and wellness. Ask your health care provider about: The right schedule for you to have regular tests and exams. Things you can do on your own to prevent diseases and keep yourself healthy. What should I know about diet, weight, and exercise? Eat a healthy diet  Eat a diet that includes plenty of vegetables, fruits, low-fat dairy products, and lean protein. Do not eat a lot of foods that are high in solid fats, added sugars, or sodium. Maintain a healthy weight Body mass index (BMI) is used to identify weight problems. It estimates body fat based on height and weight. Your health care provider can help determine your BMI and help you achieve or maintain a healthy weight. Get regular exercise Get regular exercise. This is one of the most important things you can do for your health. Most adults should: Exercise for at least 150 minutes each week. The exercise should increase your heart rate and make you sweat (moderate-intensity exercise). Do strengthening exercises at least twice a week. This is in addition to the moderate-intensity exercise. Spend less time sitting. Even light physical activity can be beneficial. Watch cholesterol and blood lipids Have your blood tested for lipids and cholesterol at 52 years of age, then have this test every 5 years. Have your cholesterol levels checked more often if: Your lipid or cholesterol levels are high. You are older than 52 years of age. You are at high risk for heart disease. What should I know about cancer screening? Depending on your health history and family history, you may need to have cancer screening at various ages. This may include screening for: Breast cancer. Cervical cancer. Colorectal cancer. Skin cancer. Lung cancer. What should I know about heart disease, diabetes, and high blood pressure? Blood pressure and heart  disease High blood pressure causes heart disease and increases the risk of stroke. This is more likely to develop in people who have high blood pressure readings, are of African descent, or are overweight. Have your blood pressure checked: Every 3-5 years if you are 18-39 years of age. Every year if you are 40 years old or older. Diabetes Have regular diabetes screenings. This checks your fasting blood sugar level. Have the screening done: Once every three years after age 40 if you are at a normal weight and have a low risk for diabetes. More often and at a younger age if you are overweight or have a high risk for diabetes. What should I know about preventing infection? Hepatitis B If you have a higher risk for hepatitis B, you should be screened for this virus. Talk with your health care provider to find out if you are at risk for hepatitis B infection. Hepatitis C Testing is recommended for: Everyone born from 1945 through 1965. Anyone with known risk factors for hepatitis C. Sexually transmitted infections (STIs) Get screened for STIs, including gonorrhea and chlamydia, if: You are sexually active and are younger than 52 years of age. You are older than 52 years of age and your health care provider tells you that you are at risk for this type of infection. Your sexual activity has changed since you were last screened, and you are at increased risk for chlamydia or gonorrhea. Ask your health care provider if you are at risk. Ask your health care provider about whether you are at high risk for HIV. Your health care provider may recommend a prescription medicine   to help prevent HIV infection. If you choose to take medicine to prevent HIV, you should first get tested for HIV. You should then be tested every 3 months for as long as you are taking the medicine. Pregnancy If you are about to stop having your period (premenopausal) and you may become pregnant, seek counseling before you get  pregnant. Take 400 to 800 micrograms (mcg) of folic acid every day if you become pregnant. Ask for birth control (contraception) if you want to prevent pregnancy. Osteoporosis and menopause Osteoporosis is a disease in which the bones lose minerals and strength with aging. This can result in bone fractures. If you are 65 years old or older, or if you are at risk for osteoporosis and fractures, ask your health care provider if you should: Be screened for bone loss. Take a calcium or vitamin D supplement to lower your risk of fractures. Be given hormone replacement therapy (HRT) to treat symptoms of menopause. Follow these instructions at home: Lifestyle Do not use any products that contain nicotine or tobacco, such as cigarettes, e-cigarettes, and chewing tobacco. If you need help quitting, ask your health care provider. Do not use street drugs. Do not share needles. Ask your health care provider for help if you need support or information about quitting drugs. Alcohol use Do not drink alcohol if: Your health care provider tells you not to drink. You are pregnant, may be pregnant, or are planning to become pregnant. If you drink alcohol: Limit how much you use to 0-1 drink a day. Limit intake if you are breastfeeding. Be aware of how much alcohol is in your drink. In the [place] one drink equals one 12 oz bottle of beer (355 mL), one 5 oz glass of wine (148 mL), or one 1 oz glass of hard liquor (44 mL). General instructions Schedule regular health, dental, and eye exams. Stay current with your vaccines. Tell your health care provider if: You often feel depressed. You have ever been abused or do not feel safe at home. Summary Adopting a healthy lifestyle and getting preventive care are important in promoting health and wellness. Follow your health care provider's instructions about healthy diet, exercising, and getting tested or screened for diseases. Follow your health care provider's  instructions on monitoring your cholesterol and blood pressure. This information is not intended to replace advice given to you by your health care provider. Make sure you discuss any questions you have with your health care provider. Document Revised: 06/28/2020 Document Reviewed: 04/13/2018 Elsevier Patient Education  2022 Elsevier Inc.  

## 2021-01-29 NOTE — Progress Notes (Signed)
I,Jameka J Llittleton,acting as a Education administrator for [employer], NP.,have documented all relevant documentation on the behalf of [employer], NP,as directed by  Bary Castilla, NP while in the presence of Bary Castilla, NP.  This visit occurred during the SARS-CoV-2 public health emergency.  Safety protocols were in place, including screening questions prior to the visit, additional usage of staff PPE, and extensive cleaning of exam room while observing appropriate contact time as indicated for disinfecting solutions.  Subjective:     Patient ID: Kara Fry , female    DOB: October 23, 1970 , 50 y.o.   MRN: [id number]   Chief Complaint  Patient presents with   Annual Exam    HPI  Patient presents today for her hm. She is followed by Dr.Roberts for her GYN care. She takes leave 100 every day.  Diet: She is trying to eat.  Exercise: She walks and jumps rope She has not had a mammogram this year. She usually gets it in Oct.  Dentist and eye doctor regularly. Still having periods occasionally. 2 months ago. No contraceptives.  She has no other concerns today.     Past Medical History:  Diagnosis Date   Hypothyroidism      Family History  Problem Relation Age of Onset   Breast cancer Mother 68   Breast cancer Sister 5   Breast cancer Maternal Aunt    Breast cancer Maternal Grandmother      Current Outpatient Medications:    levothyroxine (SYNTHROID) 112 MCG tablet, Take 112 mcg by mouth daily before breakfast. On sundays, Disp: , Rfl:    levothyroxine (SYNTHROID) 100 MCG tablet, TAKE 1 TABLET BY MOUTH DAILY MONDAY THROUGH FRIDAY AND HOLD ON SATURDAY AND SUNDAY (Patient not taking: Reported on 01/29/2021), Disp: 60 tablet, Rfl: 1   Allergies  Allergen Reactions   Penicillins    Shellfish Allergy       The patient states she uses none for birth control. Last LMP was No LMP recorded. (Menstrual status: Irregular Periods).. . Negative for: breast discharge, breast  lump(s), breast pain and breast self exam. Associated symptoms include abnormal vaginal bleeding. Pertinent negatives include abnormal bleeding (hematology), anxiety, decreased libido, depression, difficulty falling sleep, dyspareunia, history of infertility, nocturia, sexual dysfunction, sleep disturbances, urinary incontinence, urinary urgency, vaginal discharge and vaginal itching. Diet regular.The patient states her exercise level is    . The patient's tobacco use is:  Social History   Tobacco Use  Smoking Status Never  Smokeless Tobacco Never  . She has been exposed to passive smoke. The patient's alcohol use is:  Social History   Substance and Sexual Activity  Alcohol Use Never  . Additional information: Last pap , next one scheduled for .    Review of Systems  Constitutional: Negative.  Negative for chills, fatigue and fever.  HENT: Negative.  Negative for congestion and sneezing.   Eyes: Negative.   Respiratory: Negative.  Negative for shortness of breath and wheezing.   Cardiovascular: Negative.  Negative for chest pain and palpitations.  Gastrointestinal: Negative.   Endocrine: Negative.   Genitourinary: Negative.   Musculoskeletal: Negative.  Negative for arthralgias and myalgias.  Skin: Negative.   Allergic/Immunologic: Negative.   Neurological: Negative.  Negative for dizziness, weakness and headaches.  Hematological: Negative.   Psychiatric/Behavioral: Negative.      Today's Vitals   01/29/21 0953  BP: 112/70  Pulse: 85  Temp: 97.9 F (36.6 C)  Weight: 215 lb 6.4 oz (97.7 kg)  Height: 5' 6.5" (1.689  m)  PainSc: 0-No pain   Body mass index is 34.25 kg/m.  Wt Readings from Last 3 Encounters:  01/29/21 215 lb 6.4 oz (97.7 kg)  07/09/20 218 lb 12.8 oz (99.2 kg)  04/10/20 220 lb (99.8 kg)     Objective:  Physical Exam Vitals and nursing note reviewed.  Constitutional:      Appearance: Normal appearance.  HENT:     Head: Normocephalic and atraumatic.      Right Ear: Tympanic membrane, ear canal and external ear normal. There is no impacted cerumen.     Left Ear: Tympanic membrane, ear canal and external ear normal. There is no impacted cerumen.     Nose: No congestion or rhinorrhea.     Mouth/Throat:     Mouth: Mucous membranes are moist.     Pharynx: Oropharynx is clear.  Eyes:     Extraocular Movements: Extraocular movements intact.     Conjunctiva/sclera: Conjunctivae normal.     Pupils: Pupils are equal, round, and reactive to light.  Cardiovascular:     Rate and Rhythm: Normal rate and regular rhythm.     Pulses: Normal pulses.     Heart sounds: Normal heart sounds. No murmur heard. Pulmonary:     Effort: Pulmonary effort is normal. No respiratory distress.     Breath sounds: Normal breath sounds. No wheezing.  Chest:  Breasts:    Tanner Score is 5.     Right: Normal.     Left: Normal.  Abdominal:     General: Abdomen is flat. Bowel sounds are normal.     Palpations: Abdomen is soft.  Genitourinary:    Comments: deferred Musculoskeletal:        General: Normal range of motion.     Cervical back: Normal range of motion and neck supple.  Skin:    General: Skin is warm and dry.     Capillary Refill: Capillary refill takes less than 2 seconds.  Neurological:     General: No focal deficit present.     Mental Status: She is alert and oriented to person, place, and time.  Psychiatric:        Mood and Affect: Mood normal.        Behavior: Behavior normal.        Assessment And Plan:     1. Encounter for general adult medical examination w/o abnormal findings --Patient is here for their annual physical exam and we discussed any changes to medication and medical history.  -Behavior modification was discussed as well as diet and exercise history  -Patient will continue to exercise regularly and modify their diet.  -Recommendation for yearly physical annuals, immunization and screenings including mammogram and colonoscopy were  discussed with the patient.  -Recommended intake of multivitamin, vitamin D and calcium.  -Individualized advise was given to the patient pertaining to their own health history in regards to diet, exercise, medical condition and referrals.  - CBC - CMP14+EGFR - Lipid panel  2. Acquired hypothyroidism -Will check her thyroid level today.  -She is taking lev. 100 mcg daily.  - TSH - T3, free - T4, Free  3. Prediabetes - Hemoglobin A1c  4. Influenza vaccination declined -Patient does not want to get vaccine today   5. Vitamin D deficiency -Will check and supplement if needed. Advised patient to spend atleast 15 min. Daily in sunlight.  - Vitamin D (25 hydroxy)  6. Class 1 obesity due to excess calories with serious comorbidity and body mass index (  BMI) of 34.0 to 34.9 in adult -Advised patient on a healthy diet including avoiding fast food and red meats. Increase the intake of lean meats including grilled chicken and [place].  Drink a lot of water. Decrease intake of fatty foods. Exercise for 30-45 min. 4-5 a week to decrease the risk of cardiac event.   Side effects and appropriate use of all the medication(s) were discussed with the patient today. Patient advised to use the medication(s) as directed by their healthcare provider. The patient was encouraged to read, review, and understand all associated package inserts and contact our office with any questions or concerns. The patient accepts the risks of the treatment plan and had an opportunity to ask questions.   The patient was encouraged to call or send a message through [facility] for any questions or concerns.   Follow up: if symptoms persist or do not get better.   Staying healthy and adopting a healthy lifestyle for your overall health is important. You should eat 7 or more servings of fruits and vegetables per day. You should drink plenty of water to keep yourself hydrated and your kidneys healthy. This includes about 65-80+ fluid  ounces of water. Limit your intake of animal fats especially for elevated cholesterol. Avoid highly processed food and limit your salt intake if you have hypertension. Avoid foods high in saturated/Trans fats. Along with a healthy diet it is also very important to maintain time for yourself to maintain a healthy mental health with low stress levels. You should get atleast 150 min of moderate intensity exercise weekly for a healthy heart. Along with eating right and exercising, aim for at least 7-9 hours of sleep daily.  Eat more whole grains which includes barley, wheat berries, oats, brown rice and whole wheat pasta. Use healthy plant oils which include olive, soy, corn, sunflower and peanut. Limit your caffeine and sugary drinks. Limit your intake of fast foods. Limit milk and dairy products to one or two daily servings.   Patient was given opportunity to ask questions. Patient verbalized understanding of the plan and was able to repeat key elements of the plan. All questions were answered to their satisfaction.  Raman Daanya Lanphier, DNP   I, Raman Talis Iwan have reviewed all documentation for this visit. The documentation on 01/29/21 for the exam, diagnosis, procedures, and orders are all accurate and complete.    THE PATIENT IS ENCOURAGED TO PRACTICE SOCIAL DISTANCING DUE TO THE COVID-19 PANDEMIC.

## 2021-01-30 ENCOUNTER — Other Ambulatory Visit: Payer: Self-pay | Admitting: Nurse Practitioner

## 2021-01-30 DIAGNOSIS — E119 Type 2 diabetes mellitus without complications: Secondary | ICD-10-CM

## 2021-01-30 DIAGNOSIS — E559 Vitamin D deficiency, unspecified: Secondary | ICD-10-CM

## 2021-01-30 LAB — T3, FREE: T3, Free: 2.6 pg/mL (ref 2.0–4.4)

## 2021-01-30 LAB — CBC
Hematocrit: 37.2 % (ref 34.0–46.6)
Hemoglobin: 12.5 g/dL (ref 11.1–15.9)
MCH: 27.7 pg (ref 26.6–33.0)
MCHC: 33.6 g/dL (ref 31.5–35.7)
MCV: 83 fL (ref 79–97)
Platelets: 368 10*3/uL (ref 150–450)
RBC: 4.51 x10E6/uL (ref 3.77–5.28)
RDW: 14.2 % (ref 11.7–15.4)
WBC: 5 10*3/uL (ref 3.4–10.8)

## 2021-01-30 LAB — CMP14+EGFR
ALT: 21 IU/L (ref 0–32)
AST: 18 IU/L (ref 0–40)
Albumin/Globulin Ratio: 1.4 (ref 1.2–2.2)
Albumin: 4.2 g/dL (ref 3.8–4.9)
Alkaline Phosphatase: 90 IU/L (ref 44–121)
BUN/Creatinine Ratio: 10 (ref 9–23)
BUN: 9 mg/dL (ref 6–24)
Bilirubin Total: 0.5 mg/dL (ref 0.0–1.2)
CO2: 23 mmol/L (ref 20–29)
Calcium: 9.2 mg/dL (ref 8.7–10.2)
Chloride: 101 mmol/L (ref 96–106)
Creatinine, Ser: 0.94 mg/dL (ref 0.57–1.00)
Globulin, Total: 3 g/dL (ref 1.5–4.5)
Glucose: 99 mg/dL (ref 70–99)
Potassium: 4.3 mmol/L (ref 3.5–5.2)
Sodium: 137 mmol/L (ref 134–144)
Total Protein: 7.2 g/dL (ref 6.0–8.5)
eGFR: 73 mL/min/{1.73_m2} (ref >59)

## 2021-01-30 LAB — HEMOGLOBIN A1C
Est. average glucose Bld gHb Est-mCnc: 146 mg/dL
Hgb A1c MFr Bld: 6.7 % — ABNORMAL HIGH (ref 4.8–5.6)

## 2021-01-30 LAB — LIPID PANEL
Chol/HDL Ratio: 3.8 ratio (ref 0.0–4.4)
Cholesterol, Total: 211 mg/dL — ABNORMAL HIGH (ref 100–199)
HDL: 56 mg/dL (ref >39)
LDL Chol Calc (NIH): 142 mg/dL — ABNORMAL HIGH (ref 0–99)
Triglycerides: 71 mg/dL (ref 0–149)
VLDL Cholesterol Cal: 13 mg/dL (ref 5–40)

## 2021-01-30 LAB — VITAMIN D 25 HYDROXY (VIT D DEFICIENCY, FRACTURES): Vit D, 25-Hydroxy: 24.1 ng/mL — ABNORMAL LOW (ref 30.0–100.0)

## 2021-01-30 LAB — T4, FREE: Free T4: 1.53 ng/dL (ref 0.82–1.77)

## 2021-01-30 LAB — TSH: TSH: 0.703 u[IU]/mL (ref 0.450–4.500)

## 2021-01-30 MED ORDER — METFORMIN HCL 500 MG PO TABS: 500.0000 mg | ORAL_TABLET | Freq: Two times a day (BID) | ORAL | 3 refills | Status: DC

## 2021-01-30 MED ORDER — VITAMIN D (ERGOCALCIFEROL) 1.25 MG (50000 UNIT) PO CAPS
50000.0000 [IU] | ORAL_CAPSULE | ORAL | 0 refills | Status: DC
Start: 2021-01-30 — End: 2021-12-05

## 2021-04-16 LAB — HM PAP SMEAR: HPV, high-risk: NEGATIVE

## 2021-04-16 LAB — HM MAMMOGRAPHY

## 2021-04-16 LAB — TSH: TSH: 12.9 — AB (ref <=5.90)

## 2021-04-17 LAB — HEMOGLOBIN A1C: Hemoglobin A1C: 6.2

## 2021-04-21 ENCOUNTER — Encounter: Payer: Self-pay | Admitting: Internal Medicine

## 2021-05-02 ENCOUNTER — Other Ambulatory Visit: Payer: Self-pay | Admitting: Nurse Practitioner

## 2021-05-02 DIAGNOSIS — E039 Hypothyroidism, unspecified: Secondary | ICD-10-CM

## 2021-06-26 ENCOUNTER — Other Ambulatory Visit: Payer: Self-pay

## 2021-06-26 DIAGNOSIS — E119 Type 2 diabetes mellitus without complications: Secondary | ICD-10-CM

## 2021-06-26 DIAGNOSIS — E039 Hypothyroidism, unspecified: Secondary | ICD-10-CM

## 2021-06-26 MED ORDER — LEVOTHYROXINE SODIUM 100 MCG PO TABS: ORAL_TABLET | ORAL | 1 refills | Status: DC

## 2021-06-26 MED ORDER — METFORMIN HCL 500 MG PO TABS: 500.0000 mg | ORAL_TABLET | Freq: Two times a day (BID) | ORAL | 3 refills | Status: DC

## 2021-06-27 ENCOUNTER — Other Ambulatory Visit: Payer: Self-pay

## 2021-06-27 DIAGNOSIS — E119 Type 2 diabetes mellitus without complications: Secondary | ICD-10-CM

## 2021-07-04 ENCOUNTER — Other Ambulatory Visit: Payer: Self-pay

## 2021-09-11 ENCOUNTER — Other Ambulatory Visit: Payer: Self-pay | Admitting: Obstetrics and Gynecology

## 2021-10-30 ENCOUNTER — Other Ambulatory Visit: Payer: Self-pay | Admitting: Obstetrics and Gynecology

## 2021-12-03 ENCOUNTER — Ambulatory Visit: Payer: 59 | Admitting: Internal Medicine

## 2021-12-03 ENCOUNTER — Encounter: Payer: Self-pay | Admitting: Internal Medicine

## 2021-12-03 VITALS — BP 128/64 | HR 78 | Temp 98.1°F | Ht 66.5 in | Wt 194.0 lb

## 2021-12-03 DIAGNOSIS — E039 Hypothyroidism, unspecified: Secondary | ICD-10-CM | POA: Diagnosis not present

## 2021-12-03 DIAGNOSIS — Z6830 Body mass index (BMI) 30.0-30.9, adult: Secondary | ICD-10-CM

## 2021-12-03 DIAGNOSIS — E1169 Type 2 diabetes mellitus with other specified complication: Secondary | ICD-10-CM | POA: Diagnosis not present

## 2021-12-03 DIAGNOSIS — R252 Cramp and spasm: Secondary | ICD-10-CM | POA: Diagnosis not present

## 2021-12-03 DIAGNOSIS — E6609 Other obesity due to excess calories: Secondary | ICD-10-CM

## 2021-12-03 DIAGNOSIS — E119 Type 2 diabetes mellitus without complications: Secondary | ICD-10-CM

## 2021-12-03 LAB — POCT URINALYSIS DIPSTICK
Bilirubin, UA: NEGATIVE
Blood, UA: NEGATIVE
Glucose, UA: NEGATIVE
Ketones, UA: NEGATIVE
Nitrite, UA: NEGATIVE
Protein, UA: NEGATIVE
Spec Grav, UA: 1.01 (ref 1.010–1.025)
Urobilinogen, UA: 0.2 E.U./dL
pH, UA: 6.5 (ref 5.0–8.0)

## 2021-12-03 NOTE — Patient Instructions (Signed)

## 2021-12-03 NOTE — Progress Notes (Signed)
I,Tianna Badgett,acting as a Education administrator for Maximino Greenland, MD.,have documented all relevant documentation on the behalf of Maximino Greenland, MD,as directed by  Maximino Greenland, MD while in the presence of Maximino Greenland, MD.  Subjective:     Patient ID: Kara Fry , female    DOB: 03-Jul-1970 , 51 y.o.   MRN: [id number]   Chief Complaint  Patient presents with   Thyroid Problem    HPI  The patient is here today for a thyroid f/u.  She is currently taking levothyroxine 154mcg daily M-F. She reports compliance with meds.  She has not had any issues with the medication. She admits that she is under some stress, her husband was diagnosed with amyloidosis last Winter.   Thyroid Problem Presents for follow-up visit. Patient reports no cold intolerance, constipation, depressed mood, hoarse voice or weight loss. The symptoms have been stable.     Past Medical History:  Diagnosis Date   Hypothyroidism      Family History  Problem Relation Age of Onset   Breast cancer Mother 54   Breast cancer Sister 59   Breast cancer Maternal Aunt    Breast cancer Maternal Grandmother      Current Outpatient Medications:    levothyroxine (SYNTHROID) 100 MCG tablet, Take one tablet by mouth daily Monday through Friday and hold on Saturday and Sunday., Disp: 60 tablet, Rfl: 1   metFORMIN (GLUCOPHAGE) 500 MG tablet, Take 1 tablet (500 mg total) by mouth 2 (two) times daily with a meal., Disp: 60 tablet, Rfl: 3   Vitamin D, Ergocalciferol, (DRISDOL) 1.25 MG (50000 UNIT) CAPS capsule, Take 1 capsule (50,000 Units total) by mouth every 7 (seven) days., Disp: 12 capsule, Rfl: 0   Allergies  Allergen Reactions   Penicillins    Shellfish Allergy      Review of Systems  Constitutional: Negative.  Negative for weight loss.  HENT:  Negative for hoarse voice.   Respiratory: Negative.    Cardiovascular: Negative.   Gastrointestinal: Negative.  Negative for constipation.  Endocrine: Negative for cold  intolerance.  Musculoskeletal:  Positive for myalgias.  Neurological: Negative.      Today's Vitals   12/03/21 1457  BP: 128/64  Pulse: 78  Temp: 98.1 F (36.7 C)  TempSrc: Oral  Weight: 194 lb (88 kg)  Height: 5' 6.5" (1.689 m)   Body mass index is 30.84 kg/m.  Wt Readings from Last 3 Encounters:  12/03/21 194 lb (88 kg)  01/29/21 215 lb 6.4 oz (97.7 kg)  07/09/20 218 lb 12.8 oz (99.2 kg)    Objective:  Physical Exam Vitals and nursing note reviewed.  Constitutional:      Appearance: Normal appearance.  HENT:     Head: Normocephalic and atraumatic.  Eyes:     Extraocular Movements: Extraocular movements intact.  Cardiovascular:     Rate and Rhythm: Normal rate and regular rhythm.     Heart sounds: Normal heart sounds.  Pulmonary:     Effort: Pulmonary effort is normal.     Breath sounds: Normal breath sounds.  Musculoskeletal:     Cervical back: Normal range of motion.  Skin:    General: Skin is warm.  Neurological:     General: No focal deficit present.     Mental Status: She is alert.  Psychiatric:        Mood and Affect: Mood normal.        Behavior: Behavior normal.  Assessment And Plan:     1. Diabetes mellitus type II, non insulin dependent ([facility]) Comments: Newly diagnosed, now on metformin. I will check a1c/renal function. She will rto in Oct 2023 for her next physical examination.  - Urine microalbumin-creatinine with uACR - Hemoglobin A1c - CMP14+EGFR - POCT Urinalysis Dipstick (81002)  2. Acquired hypothyroidism Comments: I will check thyroid panel and adjust meds as needed.  - Thyroid Panel With TSH  3. Leg cramps Comments: I will check CMP, Mg level today. She is encouraged to stay well hydrated.  - Magnesium  4. Class 1 obesity due to excess calories with serious comorbidity and body mass index (BMI) of 30.0 to 30.9 in adult Comments: She was congratulated on her 21 lb weight loss since Sept 2022. Encouraged to aim for at  leastt 150 minutes of exercise/week.   Patient was given opportunity to ask questions. Patient verbalized understanding of the plan and was able to repeat key elements of the plan. All questions were answered to their satisfaction.   I, Maximino Greenland, MD, have reviewed all documentation for this visit. The documentation on 12/03/21 for the exam, diagnosis, procedures, and orders are all accurate and complete.   IF YOU HAVE BEEN REFERRED TO A SPECIALIST, IT MAY TAKE 1-2 WEEKS TO SCHEDULE/PROCESS THE REFERRAL. IF YOU HAVE NOT HEARD FROM US/SPECIALIST IN TWO WEEKS, PLEASE GIVE [place] A CALL AT [phone].   THE PATIENT IS ENCOURAGED TO PRACTICE SOCIAL DISTANCING DUE TO THE COVID-19 PANDEMIC.

## 2021-12-04 LAB — MAGNESIUM: Magnesium: 1.9 mg/dL (ref 1.6–2.3)

## 2021-12-04 LAB — THYROID PANEL WITH TSH
Free Thyroxine Index: 2.4 (ref 1.2–4.9)
T3 Uptake Ratio: 28 % (ref 24–39)
T4, Total: 8.7 ug/dL (ref 4.5–12.0)
TSH: 17.5 u[IU]/mL — ABNORMAL HIGH (ref 0.450–4.500)

## 2021-12-04 LAB — CMP14+EGFR
ALT: 10 IU/L (ref 0–32)
AST: 10 IU/L (ref 0–40)
Albumin/Globulin Ratio: 1.4 (ref 1.2–2.2)
Albumin: 4.3 g/dL (ref 3.8–4.9)
Alkaline Phosphatase: 84 IU/L (ref 44–121)
BUN/Creatinine Ratio: 11 (ref 9–23)
BUN: 10 mg/dL (ref 6–24)
Bilirubin Total: 0.4 mg/dL (ref 0.0–1.2)
CO2: 22 mmol/L (ref 20–29)
Calcium: 9.3 mg/dL (ref 8.7–10.2)
Chloride: 102 mmol/L (ref 96–106)
Creatinine, Ser: 0.93 mg/dL (ref 0.57–1.00)
Globulin, Total: 3 g/dL (ref 1.5–4.5)
Glucose: 85 mg/dL (ref 70–99)
Potassium: 4 mmol/L (ref 3.5–5.2)
Sodium: 141 mmol/L (ref 134–144)
Total Protein: 7.3 g/dL (ref 6.0–8.5)
eGFR: 74 mL/min/{1.73_m2} (ref >59)

## 2021-12-04 LAB — MICROALBUMIN / CREATININE URINE RATIO
Creatinine, Urine: 39.8 mg/dL
Microalb/Creat Ratio: <8 mg/g creat (ref 0–29)
Microalbumin, Urine: <3 ug/mL

## 2021-12-04 LAB — HEMOGLOBIN A1C
Est. average glucose Bld gHb Est-mCnc: 128 mg/dL
Hgb A1c MFr Bld: 6.1 % — ABNORMAL HIGH (ref 4.8–5.6)

## 2021-12-05 ENCOUNTER — Encounter (HOSPITAL_BASED_OUTPATIENT_CLINIC_OR_DEPARTMENT_OTHER): Payer: Self-pay | Admitting: Obstetrics and Gynecology

## 2021-12-05 NOTE — Progress Notes (Signed)
Spoke w/ via phone for pre-op interview--- pt Lab needs dos----  cbc, urine preg,  ekg  (pt does not need bmp since pt had cmp done in epic on 12-03-2021, )             Lab results------ cmp result in epic COVID test -----patient states asymptomatic no test needed Arrive at ------- 1100 on 12-12-2021 NPO after MN NO Solid Food.  Clear liquids from MN until--- 1000 Med rec completed Medications to take morning of surgery ----- synthroid Diabetic medication ----- do not take metformin morning of surgery Patient instructed no nail polish to be worn day of surgery Patient instructed to bring photo id and insurance card day of surgery Patient aware to have Driver (ride ) / caregiver  for 24 hours after surgery -- husband, justin Patient Special Instructions ----- n/a Pre-Op special Istructions ----- sent inbox message to dr Su Hilt in epic to second sign orders Patient verbalized understanding of instructions that were given at this phone interview. Patient denies shortness of breath, chest pain, fever, cough at this phone interview.

## 2021-12-09 LAB — SPECIMEN STATUS REPORT

## 2021-12-09 LAB — T4, FREE: Free T4: 1.21 ng/dL (ref 0.82–1.77)

## 2021-12-11 NOTE — Progress Notes (Signed)
Spoke with Edson Snowball at Dr Su Hilt Office to clarify patient is not staying overnight.  She will be outpatient same day surgery.

## 2021-12-12 ENCOUNTER — Encounter (HOSPITAL_BASED_OUTPATIENT_CLINIC_OR_DEPARTMENT_OTHER): Payer: Self-pay | Admitting: Obstetrics and Gynecology

## 2021-12-12 ENCOUNTER — Encounter (HOSPITAL_COMMUNITY)
Admission: RE | Disposition: A | Discharge status: Home / Self Care | Payer: Self-pay | Source: Home / Self Care | Attending: Obstetrics and Gynecology

## 2021-12-12 ENCOUNTER — Ambulatory Visit (HOSPITAL_BASED_OUTPATIENT_CLINIC_OR_DEPARTMENT_OTHER): Payer: 59 | Admitting: Anesthesiology

## 2021-12-12 ENCOUNTER — Other Ambulatory Visit: Payer: Self-pay

## 2021-12-12 ENCOUNTER — Ambulatory Visit (HOSPITAL_BASED_OUTPATIENT_CLINIC_OR_DEPARTMENT_OTHER)
Admission: RE | Admit: 2021-12-12 | Discharge: 2021-12-13 | Disposition: A | Discharge status: Home / Self Care | Payer: 59 | Attending: Obstetrics and Gynecology | Admitting: Obstetrics and Gynecology

## 2021-12-12 DIAGNOSIS — Z9889 Other specified postprocedural states: Secondary | ICD-10-CM

## 2021-12-12 DIAGNOSIS — N393 Stress incontinence (female) (male): Secondary | ICD-10-CM

## 2021-12-12 DIAGNOSIS — E119 Type 2 diabetes mellitus without complications: Secondary | ICD-10-CM | POA: Diagnosis not present

## 2021-12-12 DIAGNOSIS — Z01818 Encounter for other preprocedural examination: Secondary | ICD-10-CM

## 2021-12-12 DIAGNOSIS — E039 Hypothyroidism, unspecified: Secondary | ICD-10-CM | POA: Insufficient documentation

## 2021-12-12 HISTORY — DX: Stress incontinence (female) (male): N39.3

## 2021-12-12 HISTORY — DX: Presence of spectacles and contact lenses: Z97.3

## 2021-12-12 HISTORY — DX: Unspecified osteoarthritis, unspecified site: M19.90

## 2021-12-12 HISTORY — DX: Personal history of diseases of the blood and blood-forming organs and certain disorders involving the immune mechanism: Z86.2

## 2021-12-12 HISTORY — DX: Type 2 diabetes mellitus without complications: E11.9

## 2021-12-12 HISTORY — PX: CYSTOSCOPY: SHX5120

## 2021-12-12 HISTORY — PX: BLADDER SUSPENSION: SHX72

## 2021-12-12 LAB — CBC
HCT: 37.8 % (ref 36.0–46.0)
Hemoglobin: 12.7 g/dL (ref 12.0–15.0)
MCH: 28 pg (ref 26.0–34.0)
MCHC: 33.6 g/dL (ref 30.0–36.0)
MCV: 83.3 fL (ref 80.0–100.0)
Platelets: 327 10*3/uL (ref 150–400)
RBC: 4.54 MIL/uL (ref 3.87–5.11)
RDW: 13.5 % (ref 11.5–15.5)
WBC: 4 10*3/uL (ref 4.0–10.5)
nRBC: 0 % (ref 0.0–0.2)

## 2021-12-12 LAB — BASIC METABOLIC PANEL
Anion gap: 8 (ref 5–15)
BUN: 16 mg/dL (ref 6–20)
CO2: 25 mmol/L (ref 22–32)
Calcium: 9.4 mg/dL (ref 8.9–10.3)
Chloride: 104 mmol/L (ref 98–111)
Creatinine, Ser: 1 mg/dL (ref 0.44–1.00)
GFR, Estimated: >60 mL/min (ref >60)
Glucose, Bld: 95 mg/dL (ref 70–99)
Potassium: 3.9 mmol/L (ref 3.5–5.1)
Sodium: 137 mmol/L (ref 135–145)

## 2021-12-12 LAB — POCT PREGNANCY, URINE: Preg Test, Ur: NEGATIVE

## 2021-12-12 SURGERY — URETHROPEXY, USING TRANSVAGINAL TAPE
Anesthesia: General | Site: Pelvis

## 2021-12-12 MED ORDER — PROPOFOL 10 MG/ML IV BOLUS
INTRAVENOUS | Status: DC | PRN
  Administered 2021-12-12: 200 mg via INTRAVENOUS

## 2021-12-12 MED ORDER — CLINDAMYCIN PHOSPHATE 900 MG/50ML IV SOLN
INTRAVENOUS | Status: DC | PRN
  Administered 2021-12-12: 900 mg via INTRAVENOUS

## 2021-12-12 MED ORDER — AMISULPRIDE (ANTIEMETIC) 5 MG/2ML IV SOLN: 10.0000 mg | Freq: Once | INTRAVENOUS | Status: DC | PRN

## 2021-12-12 MED ORDER — ACETAMINOPHEN 500 MG PO TABS
1000.0000 mg | ORAL_TABLET | Freq: Four times a day (QID) | ORAL | Status: DC
  Administered 2021-12-12 – 2021-12-13 (×3): 1000 mg via ORAL
  Filled 2021-12-12 (×3): qty 2

## 2021-12-12 MED ORDER — OXYCODONE HCL 5 MG PO TABS: 5.0000 mg | ORAL_TABLET | Freq: Once | ORAL | Status: DC | PRN

## 2021-12-12 MED ORDER — ACETAMINOPHEN 500 MG PO TABS
ORAL_TABLET | ORAL | Status: AC
  Filled 2021-12-12: qty 2

## 2021-12-12 MED ORDER — DEXAMETHASONE SODIUM PHOSPHATE 4 MG/ML IJ SOLN
INTRAMUSCULAR | Status: DC | PRN
  Administered 2021-12-12: 8 mg via INTRAVENOUS

## 2021-12-12 MED ORDER — GENTAMICIN SULFATE 40 MG/ML IJ SOLN
5.0000 mg/kg | INTRAVENOUS | Status: AC
  Administered 2021-12-12: 350 mg via INTRAVENOUS
  Filled 2021-12-12: qty 8.75

## 2021-12-12 MED ORDER — POVIDONE-IODINE 10 % EX SWAB: 2.0000 | Freq: Once | CUTANEOUS | Status: DC

## 2021-12-12 MED ORDER — IBUPROFEN 600 MG PO TABS: 600.0000 mg | ORAL_TABLET | Freq: Four times a day (QID) | ORAL | 0 refills | Status: DC | PRN

## 2021-12-12 MED ORDER — SUCCINYLCHOLINE CHLORIDE 200 MG/10ML IV SOSY
PREFILLED_SYRINGE | INTRAVENOUS | Status: AC
  Filled 2021-12-12: qty 10

## 2021-12-12 MED ORDER — OXYCODONE HCL 5 MG PO TABS: 5.0000 mg | ORAL_TABLET | ORAL | Status: DC | PRN

## 2021-12-12 MED ORDER — VASOPRESSIN 20 UNIT/ML IV SOLN
INTRAVENOUS | Status: DC | PRN
  Administered 2021-12-12: 15 mL

## 2021-12-12 MED ORDER — KETOROLAC TROMETHAMINE 30 MG/ML IJ SOLN
INTRAMUSCULAR | Status: AC
  Filled 2021-12-12: qty 3

## 2021-12-12 MED ORDER — LIDOCAINE HCL (CARDIAC) PF 100 MG/5ML IV SOSY
PREFILLED_SYRINGE | INTRAVENOUS | Status: DC | PRN
  Administered 2021-12-12: 60 mg via INTRAVENOUS

## 2021-12-12 MED ORDER — PHENYLEPHRINE HCL (PRESSORS) 10 MG/ML IV SOLN
INTRAVENOUS | Status: DC | PRN
  Administered 2021-12-12 (×4): 80 ug via INTRAVENOUS

## 2021-12-12 MED ORDER — CIPROFLOXACIN HCL 250 MG PO TABS: 250.0000 mg | ORAL_TABLET | Freq: Two times a day (BID) | ORAL | 0 refills | Status: AC

## 2021-12-12 MED ORDER — MIDAZOLAM HCL 2 MG/2ML IJ SOLN
INTRAMUSCULAR | Status: DC | PRN
  Administered 2021-12-12: 2 mg via INTRAVENOUS

## 2021-12-12 MED ORDER — EPHEDRINE 5 MG/ML INJ
INTRAVENOUS | Status: AC
  Filled 2021-12-12: qty 5

## 2021-12-12 MED ORDER — FENTANYL CITRATE (PF) 100 MCG/2ML IJ SOLN
INTRAMUSCULAR | Status: DC | PRN
Start: 2021-12-12 — End: 2021-12-12
  Administered 2021-12-12 (×2): 50 ug via INTRAVENOUS

## 2021-12-12 MED ORDER — IBUPROFEN 400 MG PO TABS: 600.0000 mg | ORAL_TABLET | Freq: Four times a day (QID) | ORAL | Status: DC

## 2021-12-12 MED ORDER — IBUPROFEN 600 MG PO TABS: 600.0000 mg | ORAL_TABLET | Freq: Four times a day (QID) | ORAL | 1 refills | Status: DC | PRN

## 2021-12-12 MED ORDER — ACETAMINOPHEN 500 MG PO TABS
1000.0000 mg | ORAL_TABLET | Freq: Once | ORAL | Status: AC
Start: 2021-12-12 — End: 2021-12-12
  Administered 2021-12-12: 1000 mg via ORAL

## 2021-12-12 MED ORDER — OXYCODONE HCL 5 MG/5ML PO SOLN: 5.0000 mg | Freq: Once | ORAL | Status: DC | PRN

## 2021-12-12 MED ORDER — PHENYLEPHRINE 80 MCG/ML (10ML) SYRINGE FOR IV PUSH (FOR BLOOD PRESSURE SUPPORT)
PREFILLED_SYRINGE | INTRAVENOUS | Status: AC
  Filled 2021-12-12: qty 10

## 2021-12-12 MED ORDER — LINEZOLID 600 MG/300ML IV SOLN
600.0000 mg | Freq: Two times a day (BID) | INTRAVENOUS | Status: DC
  Filled 2021-12-12: qty 300

## 2021-12-12 MED ORDER — ONDANSETRON HCL 4 MG/2ML IJ SOLN
INTRAMUSCULAR | Status: DC | PRN
  Administered 2021-12-12: 4 mg via INTRAVENOUS

## 2021-12-12 MED ORDER — FENTANYL CITRATE (PF) 100 MCG/2ML IJ SOLN
INTRAMUSCULAR | Status: AC
  Filled 2021-12-12: qty 2

## 2021-12-12 MED ORDER — FENTANYL CITRATE (PF) 100 MCG/2ML IJ SOLN: 25.0000 ug | INTRAMUSCULAR | Status: DC | PRN

## 2021-12-12 MED ORDER — LIDOCAINE HCL (PF) 2 % IJ SOLN
INTRAMUSCULAR | Status: AC
  Filled 2021-12-12: qty 10

## 2021-12-12 MED ORDER — KETOROLAC TROMETHAMINE 30 MG/ML IJ SOLN
30.0000 mg | Freq: Four times a day (QID) | INTRAMUSCULAR | Status: DC
  Administered 2021-12-12 – 2021-12-13 (×3): 30 mg via INTRAVENOUS
  Filled 2021-12-12 (×3): qty 1

## 2021-12-12 MED ORDER — KETOROLAC TROMETHAMINE 30 MG/ML IJ SOLN
INTRAMUSCULAR | Status: DC | PRN
  Administered 2021-12-12: 30 mg via INTRAVENOUS

## 2021-12-12 MED ORDER — OXYCODONE-ACETAMINOPHEN 5-325 MG PO TABS: 1.0000 | ORAL_TABLET | ORAL | 0 refills | Status: DC | PRN

## 2021-12-12 MED ORDER — MIDAZOLAM HCL 2 MG/2ML IJ SOLN
INTRAMUSCULAR | Status: AC
  Filled 2021-12-12: qty 2

## 2021-12-12 MED ORDER — DEXAMETHASONE SODIUM PHOSPHATE 10 MG/ML IJ SOLN
INTRAMUSCULAR | Status: AC
  Filled 2021-12-12: qty 3

## 2021-12-12 MED ORDER — SODIUM CHLORIDE 0.9 % IR SOLN
Status: DC | PRN
  Administered 2021-12-12: 1000 mL via INTRAVESICAL

## 2021-12-12 MED ORDER — LACTATED RINGERS IV SOLN: INTRAVENOUS | Status: DC

## 2021-12-12 MED ORDER — CLINDAMYCIN PHOSPHATE 900 MG/50ML IV SOLN
INTRAVENOUS | Status: AC
  Filled 2021-12-12: qty 50

## 2021-12-12 MED ORDER — ESTRADIOL 0.1 MG/GM VA CREA
TOPICAL_CREAM | VAGINAL | Status: DC | PRN
  Administered 2021-12-12: 1 via VAGINAL

## 2021-12-12 MED ORDER — ONDANSETRON HCL 4 MG/2ML IJ SOLN
INTRAMUSCULAR | Status: AC
  Filled 2021-12-12: qty 6

## 2021-12-12 MED ORDER — STERILE WATER FOR IRRIGATION IR SOLN
Status: DC | PRN
  Administered 2021-12-12: 500 mL

## 2021-12-12 MED ORDER — FENTANYL CITRATE PF 50 MCG/ML IJ SOSY: 50.0000 ug | PREFILLED_SYRINGE | INTRAMUSCULAR | Status: DC | PRN

## 2021-12-12 SURGICAL SUPPLY — 31 items
ADH SKN CLS APL DERMABOND .7 (GAUZE/BANDAGES/DRESSINGS) ×2
BAG DRN RND TRDRP ANRFLXCHMBR (UROLOGICAL SUPPLIES) ×2
BAG URINE DRAIN 2000ML AR STRL (UROLOGICAL SUPPLIES) ×3 IMPLANT
BLADE SURG 11 STRL SS (BLADE) ×3 IMPLANT
BLADE SURG 15 STRL LF DISP TIS (BLADE) ×2 IMPLANT
BLADE SURG 15 STRL SS (BLADE) ×3
CATH FOLEY 2WAY SLVR  5CC 18FR (CATHETERS) ×3
CATH FOLEY 2WAY SLVR 5CC 18FR (CATHETERS) ×2 IMPLANT
DECANTER SPIKE VIAL GLASS SM (MISCELLANEOUS) IMPLANT
DERMABOND ADVANCED (GAUZE/BANDAGES/DRESSINGS) ×1
DERMABOND ADVANCED .7 DNX12 (GAUZE/BANDAGES/DRESSINGS) ×2 IMPLANT
DRAPE STERI URO 9X17 APER PCH (DRAPES) ×3 IMPLANT
GAUZE 4X4 16PLY ~~LOC~~+RFID DBL (SPONGE) ×4 IMPLANT
GAUZE PACKING 1INX5YD STRL (GAUZE/BANDAGES/DRESSINGS) IMPLANT
GLOVE BIO SURGEON STRL SZ7 (GLOVE) ×3 IMPLANT
GLOVE BIO SURGEON STRL SZ7.5 (GLOVE) ×3 IMPLANT
GLOVE BIOGEL PI IND STRL 7.5 (GLOVE) ×2 IMPLANT
GLOVE BIOGEL PI INDICATOR 7.5 (GLOVE) ×1
GLOVE ECLIPSE 6.5 STRL STRAW (GLOVE) ×3 IMPLANT
GOWN STRL REUS W/TWL LRG LVL3 (GOWN DISPOSABLE) ×6 IMPLANT
KIT TURNOVER CYSTO (KITS) ×3 IMPLANT
NEEDLE HYPO 22GX1.5 SAFETY (NEEDLE) ×3 IMPLANT
NS IRRIG 1000ML POUR BTL (IV SOLUTION) ×3 IMPLANT
PACK VAGINAL WOMENS (CUSTOM PROCEDURE TRAY) ×3 IMPLANT
PACKING VAGINAL (PACKING) ×1 IMPLANT
SET IRRIG Y TYPE TUR BLADDER L (SET/KITS/TRAYS/PACK) ×3 IMPLANT
SLEEVE SURGEON STRL (DRAPES) ×1 IMPLANT
SLING UTERINE/ABD GYNECARE TVT (Sling) ×3 IMPLANT
SUT VIC AB 2-0 CT1 (SUTURE) ×1 IMPLANT
SUT VIC AB 2-0 SH 27 (SUTURE) ×6
SUT VIC AB 2-0 SH 27XBRD (SUTURE) ×2 IMPLANT

## 2021-12-12 NOTE — Transfer of Care (Signed)
Immediate Anesthesia Transfer of Care Note  Patient: Kara Fry  Procedure(s) Performed: TRANSVAGINAL TAPE (TVT) PROCEDURE (Pelvis) CYSTOSCOPY (Bladder)  Patient Location: PACU  Anesthesia Type:General  Level of Consciousness: awake and patient cooperative  Airway & Oxygen Therapy: Patient Spontanous Breathing and Patient connected to nasal cannula oxygen  Post-op Assessment: Report given to RN and Post -op Vital signs reviewed and stable  Post vital signs: Reviewed and stable  Last Vitals:  Vitals Value Taken Time  BP    Temp    Pulse    Resp    SpO2      Last Pain:  Vitals:   12/12/21 1141  TempSrc: Oral  PainSc: 0-No pain      Patients Stated Pain Goal: 3 (12/12/21 1141)  Complications: No notable events documented.

## 2021-12-12 NOTE — H&P (Signed)
Kara Fry is an 51 y.o. female. Pt known to me presenting for TVT/Cystoscopy  Pertinent Gynecological History: LMP Perimenopausal, denies h/o abnl pap  Menstrual History: No LMP recorded. (Menstrual status: Perimenopausal).    Past Medical History:  Diagnosis Date   History of anemia    Hypothyroidism    followed by pcp   OA (osteoarthritis)    SUI (stress urinary incontinence, female)    Type 2 diabetes mellitus (HCC)    followed by pcp   (12-05-2021 per stated she does not check blood sugar)   Wears glasses     Past Surgical History:  Procedure Laterality Date   BREAST BIOPSY Right 2012   COLONOSCOPY  2020   HYSTEROSCOPY W/ ENDOMETRIAL ABLATION  03/12/2009   @[facility]   BY DR A. Saskia Simerson   KNEE ARTHROSCOPY Right 1999    Family History  Problem Relation Age of Onset   Breast cancer Mother 4   Breast cancer Sister 66   Breast cancer Maternal Aunt    Breast cancer Maternal Grandmother     Social History:  reports that she has never smoked. She has never used smokeless tobacco. She reports that she does not drink alcohol and does not use drugs.  Allergies:  Allergies  Allergen Reactions   Penicillins Shortness Of Breath and Swelling   Shellfish Allergy Shortness Of Breath and Swelling    ALL TYPES OF SHELLFISH    Medications Prior to Admission  Medication Sig Dispense Refill Last Dose   Cholecalciferol (VITAMIN D-3 PO) Take 1 capsule by mouth daily.   12/11/2021   ibuprofen (ADVIL) 600 MG tablet Take 600 mg by mouth every 6 (six) hours as needed.   12/08/2021   levothyroxine (SYNTHROID) 100 MCG tablet Take one tablet by mouth daily Monday through Friday and hold on Saturday and Sunday. (Patient taking differently: Take 100 mcg by mouth daily before breakfast. Take one tablet by mouth daily Monday through Friday and hold on Saturday and Sunday.) 60 tablet 1 12/12/2021 at 0800   metFORMIN (GLUCOPHAGE) 500 MG tablet Take 1 tablet (500 mg total) by mouth 2 (two) times  daily with a meal. (Patient taking differently: Take 500 mg by mouth 2 (two) times daily with a meal.) 60 tablet 3 12/11/2021    Review of Systems Denies F/C/N/V/D  Blood pressure (!) 144/76, pulse 80, temperature (!) 97.5 F (36.4 C), temperature source Oral, resp. rate 16, height 5\' 7"  (1.702 m), weight 85.7 kg, SpO2 100 %. Physical Exam Lungs CTA CV RRR Abdomen soft, NT Ext no calf tenderness  Results for orders placed or performed during the hospital encounter of 12/12/21 (from the past 24 hour(s))  Pregnancy, urine POC     Status: None   Collection Time: 12/12/21 11:17 AM  Result Value Ref Range   Preg Test, Ur NEGATIVE NEGATIVE  CBC     Status: None   Collection Time: 12/12/21 12:05 PM  Result Value Ref Range   WBC 4.0 4.0 - 10.5 K/uL   RBC 4.54 3.87 - 5.11 MIL/uL   Hemoglobin 12.7 12.0 - 15.0 g/dL   HCT 02/11/22 02/11/22 - 02.5 %   MCV 83.3 80.0 - 100.0 fL   MCH 28.0 26.0 - 34.0 pg   MCHC 33.6 30.0 - 36.0 g/dL   RDW 42.7 06.2 - 37.6 %   Platelets 327 150 - 400 K/uL   nRBC 0.0 0.0 - 0.2 %  Basic metabolic panel     Status: None   Collection Time: 12/12/21  12:05 PM  Result Value Ref Range   Sodium 137 135 - 145 mmol/L   Potassium 3.9 3.5 - 5.1 mmol/L   Chloride 104 98 - 111 mmol/L   CO2 25 22 - 32 mmol/L   Glucose, Bld 95 70 - 99 mg/dL   BUN 16 6 - 20 mg/dL   Creatinine, Ser 0.93 0.44 - 1.00 mg/dL   Calcium 9.4 8.9 - 23.5 mg/dL   GFR, Estimated >57 >32 mL/min   Anion gap 8 5 - 15    No results found.  Assessment/Plan: P3 with SUI presenting for TVT/Cystoscopy.  Risks benefits alternatives discussed with the patient including but not limited to bleeding infection injury risk of retention with possible need for prolonged foley or releasing mesh, mesh erosion.Marland KitchenMarland KitchenQuestions answered.  Pt verbalized understanding and consent signed and witnessed.  Purcell Nails 12/12/2021, 1:34 PM

## 2021-12-12 NOTE — Anesthesia Preprocedure Evaluation (Signed)
Anesthesia Evaluation  Patient identified by MRN, date of birth, ID band Patient awake    Reviewed: Allergy & Precautions, NPO status , Patient's Chart, lab work & pertinent test results  Airway Mallampati: II  TM Distance: >3 FB Neck ROM: Full    Dental  (+) Dental Advisory Given   Pulmonary neg pulmonary ROS,    breath sounds clear to auscultation       Cardiovascular negative cardio ROS   Rhythm:Regular Rate:Normal     Neuro/Psych negative neurological ROS     GI/Hepatic negative GI ROS, Neg liver ROS,   Endo/Other  diabetes, Type 2Hypothyroidism   Renal/GU negative Renal ROS     Musculoskeletal  (+) Arthritis ,   Abdominal   Peds  Hematology negative hematology ROS (+)   Anesthesia Other Findings   Reproductive/Obstetrics                             Anesthesia Physical Anesthesia Plan  ASA: 2  Anesthesia Plan: General   Post-op Pain Management: Tylenol PO (pre-op)* and Toradol IV (intra-op)*   Induction: Intravenous  PONV Risk Score and Plan: 3 and Dexamethasone, Ondansetron, Midazolam and Treatment may vary due to age or medical condition  Airway Management Planned: LMA  Additional Equipment: None  Intra-op Plan:   Post-operative Plan: Extubation in OR  Informed Consent: I have reviewed the patients History and Physical, chart, labs and discussed the procedure including the risks, benefits and alternatives for the proposed anesthesia with the patient or authorized representative who has indicated his/her understanding and acceptance.     Dental advisory given  Plan Discussed with: CRNA  Anesthesia Plan Comments:         Anesthesia Quick Evaluation

## 2021-12-12 NOTE — Plan of Care (Signed)
  Problem: Education: Goal: Knowledge of the prescribed therapeutic regimen will improve Outcome: Progressing   Problem: Education: Goal: Knowledge of General Education information will improve Description: Including pain rating scale, medication(s)/side effects and non-pharmacologic comfort measures Outcome: Progressing   Problem: Activity: Goal: Risk for activity intolerance will decrease Outcome: Progressing   Problem: Nutrition: Goal: Adequate nutrition will be maintained Outcome: Progressing   Problem: Elimination: Goal: Will not experience complications related to bowel motility Outcome: Progressing   Problem: Pain Managment: Goal: General experience of comfort will improve Outcome: Progressing

## 2021-12-12 NOTE — Op Note (Signed)
Preop Diagnosis: SUI  Postop Diagnosis: SUI  Procedure:1.TVT 2. Cystoscopy  Anesthesia: See chart  Fluids: 800cc  UOP: pt voided prior to procedure  EBL: 10cc  Complications:none  Procedure:The patient was taken to the operating room after the risks, benefits and alternatives were discussed with patient, the patient verbalized understanding and consent signed and witnessed. The patient was placed under general anesthesia and prepped and draped in the normal sterile fashion in the dorsal lithotomy position. A weighted speculum was placed in the patient's vagina and the anterior vaginal wall was injected with dilute pitressin at a concentration of 20 units of pitressin in a total of 100cc of normal saline.  An incision was made in the anterior wall of the vagina for approximately 1cm beneath the midurethra and the underlying tissue was dissected away from the anterior vaginal wall down to the level of the lower symphysis pubis bilaterally. Attention was then turned to the mons pubis where two 5 mm incisions were made 2 fingerbreadths from the midline. The transabdominal guide was then passed through the mons pubis incision on the patient's right down through the space of Retzius and out through the anterior vaginal wall after deflecting the rigid urethral catheter guide to the ipsilateral side. The same was done on the contralateral side. Cystoscopy was performed and no invadvertant bladder injury was noted. The bladder was drained with a Foley while deflecting the rigid urethral catheter guide to the patient's right and the mesh was attached to the transabdominal guide and elevated up through the space of Retzius and out through the incision on the mons pubis on the ipsilateral side. The same was done on the contralateral side. Cystoscopy was performed again and no inadvertant bladder injury was noted. The 48 French Foley was left in the urethra and a large Tresa Endo was placed between the urethra and the  mesh in order to leave the mesh slack beneath the midurethra. The mesh was then cut flush with the skin at the mons pubis incisions bilaterally.  Cystoscopy was performed again and bilateral ureters were noted to efflux without difficulty. The bilateral incisions on the mons pubis were then cleaned and dermabond applied. The anterior vaginal wall incision was repaired with 2-0 vicryl with a running interlocking stitch.  Vagina was packed with estrogen soaked vaginal packing.  Sponge, lap and needle count was correct.  The patient tolerated the procedure well and was returned to the recovery room in good condition.   I was present and scrubbed and the assistant was required due to complexity of anatomy.

## 2021-12-12 NOTE — Anesthesia Procedure Notes (Signed)
Procedure Name: LMA Insertion Date/Time: 12/12/2021 1:54 PM  Performed by: Earmon Phoenix, CRNAPre-anesthesia Checklist: Patient identified, Emergency Drugs available, Suction available, Patient being monitored and Timeout performed Patient Re-evaluated:Patient Re-evaluated prior to induction Oxygen Delivery Method: Circle system utilized Preoxygenation: Pre-oxygenation with 100% oxygen Induction Type: IV induction Ventilation: Mask ventilation without difficulty LMA: LMA inserted LMA Size: 4.0 Number of attempts: 1 Placement Confirmation: positive ETCO2, CO2 detector and breath sounds checked- equal and bilateral Tube secured with: Tape Dental Injury: Teeth and Oropharynx as per pre-operative assessment

## 2021-12-12 NOTE — Anesthesia Postprocedure Evaluation (Signed)
Anesthesia Post Note  Patient: Beckie Sexton-Lewter  Procedure(s) Performed: TRANSVAGINAL TAPE (TVT) PROCEDURE (Pelvis) CYSTOSCOPY (Bladder)     Patient location during evaluation: PACU Anesthesia Type: General Level of consciousness: awake and alert Pain management: pain level controlled Vital Signs Assessment: post-procedure vital signs reviewed and stable Respiratory status: spontaneous breathing, nonlabored ventilation and respiratory function stable Cardiovascular status: blood pressure returned to baseline and stable Postop Assessment: no apparent nausea or vomiting Anesthetic complications: no   No notable events documented.  Last Vitals:  Vitals:   12/12/21 1530 12/12/21 1545  BP: 133/87 132/85  Pulse: 75 68  Resp: 14 12  Temp:    SpO2: 100% 99%    Last Pain:  Vitals:   12/12/21 1530  TempSrc:   PainSc: 0-No pain                 Ibraheem Voris,W. EDMOND

## 2021-12-13 DIAGNOSIS — N393 Stress incontinence (female) (male): Secondary | ICD-10-CM | POA: Diagnosis not present

## 2021-12-13 LAB — CBC
HCT: 31.9 % — ABNORMAL LOW (ref 36.0–46.0)
Hemoglobin: 11 g/dL — ABNORMAL LOW (ref 12.0–15.0)
MCH: 28.5 pg (ref 26.0–34.0)
MCHC: 34.5 g/dL (ref 30.0–36.0)
MCV: 82.6 fL (ref 80.0–100.0)
Platelets: 304 10*3/uL (ref 150–400)
RBC: 3.86 MIL/uL — ABNORMAL LOW (ref 3.87–5.11)
RDW: 13.4 % (ref 11.5–15.5)
WBC: 8.9 10*3/uL (ref 4.0–10.5)
nRBC: 0 % (ref 0.0–0.2)

## 2021-12-13 LAB — COMPREHENSIVE METABOLIC PANEL
ALT: 15 U/L (ref 0–44)
AST: 17 U/L (ref 15–41)
Albumin: 3.5 g/dL (ref 3.5–5.0)
Alkaline Phosphatase: 61 U/L (ref 38–126)
Anion gap: 8 (ref 5–15)
BUN: 23 mg/dL — ABNORMAL HIGH (ref 6–20)
CO2: 25 mmol/L (ref 22–32)
Calcium: 8.7 mg/dL — ABNORMAL LOW (ref 8.9–10.3)
Chloride: 105 mmol/L (ref 98–111)
Creatinine, Ser: 0.98 mg/dL (ref 0.44–1.00)
GFR, Estimated: >60 mL/min (ref >60)
Glucose, Bld: 115 mg/dL — ABNORMAL HIGH (ref 70–99)
Potassium: 4.4 mmol/L (ref 3.5–5.1)
Sodium: 138 mmol/L (ref 135–145)
Total Bilirubin: 0.5 mg/dL (ref 0.3–1.2)
Total Protein: 7.1 g/dL (ref 6.5–8.1)

## 2021-12-13 NOTE — Progress Notes (Signed)
Removed pt's vaginal packing with minimal serosanguinous drainage noted.   Pt denies pain at this time.

## 2021-12-13 NOTE — Discharge Summary (Signed)
Physician Discharge Summary  Patient ID: Kara Fry MRN: [id number] DOB/AGE: Dec 01, 1970 51 y.o.  Admit date: 12/12/2021 Discharge date: 12/13/2021  Admission Diagnoses: SUI Discharge Diagnoses:  Principal Problem:   S/p TVT/Cystoscopy   Discharged Condition: good  Hospital Course: Doing well on POD 1.  Voiding without difficulty and feels like she is emptying.  200cc/2-3hrs last void.  The others were not measured.  Tolerating regular diet.  Pt feels a little bloated but no N/V and NABS.  Pt ready for discharge.  Instructions reviewed.  Consults: None  Significant Diagnostic Studies: cbc and cmp wnl  Treatments: surgery: as above  Discharge Exam: Blood pressure (!) 107/59, pulse 75, temperature 98.2 F (36.8 C), temperature source Oral, resp. rate 18, height 5\' 7"  (1.702 m), weight 85.7 kg, SpO2 100 %. General appearance: alert and no distress Resp: clear to auscultation bilaterally Cardio: regular rate and rhythm GI: soft, app tender, ND, NABS Extremities: no calf tenderness Incision/Wound: Dermabond in place c/d Minimal vaginal bleeding  Disposition: Discharge disposition: 01-Home or Self Care       Discharge Instructions     Discharge patient   Complete by: As directed    Per protocol   Discharge disposition: 01-Home or Self Care   Discharge patient date: 12/13/2021      Allergies as of 12/13/2021       Reactions   Penicillins Shortness Of Breath, Swelling   Shellfish Allergy Shortness Of Breath, Swelling   ALL TYPES OF SHELLFISH        Medication List     TAKE these medications    ciprofloxacin 250 MG tablet Commonly known as: CIPRO Take 1 tablet (250 mg total) by mouth 2 (two) times daily for 5 days.   ibuprofen 600 MG tablet Commonly known as: ADVIL Take 1 tablet (600 mg total) by mouth every 6 (six) hours as needed. What changed: You were already taking a medication with the same name, and this prescription was added. Make sure you  understand how and when to take each.   ibuprofen 600 MG tablet Commonly known as: ADVIL Take 1 tablet (600 mg total) by mouth every 6 (six) hours as needed. What changed: Another medication with the same name was added. Make sure you understand how and when to take each.   levothyroxine 100 MCG tablet Commonly known as: SYNTHROID Take one tablet by mouth daily Monday through Friday and hold on Saturday and Sunday. What changed:  how much to take how to take this when to take this additional instructions   metFORMIN 500 MG tablet Commonly known as: GLUCOPHAGE Take 500 mg by mouth 2 (two) times daily with a meal.   metFORMIN 500 MG tablet Commonly known as: Glucophage Take 1 tablet (500 mg total) by mouth 2 (two) times daily with a meal.   oxyCODONE-acetaminophen 5-325 MG tablet Commonly known as: Percocet Take 1 tablet by mouth every 4 (four) hours as needed for severe pain.   VITAMIN D-3 PO Take 1 capsule by mouth daily.        Follow-up Information     Tuesday, MD Follow up on 01/02/2022.   Specialty: Obstetrics and Gynecology Why: follow up in office on 01/02/22 at 9:15am for post op appt Contact information: [address] [place] [phone]                 Signed: [phone] 12/13/2021, 8:09 AM

## 2021-12-13 NOTE — Progress Notes (Signed)
Assessment unchanged. Pt verbalized understanding of dc instructions through teach back regarding medications, follow up care, and when to call MD. Discharged to front entrance by NT.

## 2021-12-13 NOTE — Plan of Care (Signed)
  Problem: Education: Goal: Knowledge of the prescribed therapeutic regimen will improve Outcome: Adequate for Discharge Goal: Understanding of sexual limitations or changes related to disease process or condition will improve Outcome: Adequate for Discharge Goal: Individualized Educational Video(s) Outcome: Adequate for Discharge   Problem: Self-Concept: Goal: Communication of feelings regarding changes in body function or appearance will improve Outcome: Adequate for Discharge   Problem: Education: Goal: Knowledge of General Education information will improve Description: Including pain rating scale, medication(s)/side effects and non-pharmacologic comfort measures Outcome: Adequate for Discharge   Problem: Health Behavior/Discharge Planning: Goal: Ability to manage health-related needs will improve Outcome: Adequate for Discharge   Problem: Clinical Measurements: Goal: Ability to maintain clinical measurements within normal limits will improve Outcome: Adequate for Discharge Goal: Will remain free from infection Outcome: Adequate for Discharge Goal: Diagnostic test results will improve Outcome: Adequate for Discharge Goal: Respiratory complications will improve Outcome: Adequate for Discharge Goal: Cardiovascular complication will be avoided Outcome: Adequate for Discharge   Problem: Activity: Goal: Risk for activity intolerance will decrease Outcome: Adequate for Discharge   Problem: Nutrition: Goal: Adequate nutrition will be maintained Outcome: Adequate for Discharge   Problem: Coping: Goal: Level of anxiety will decrease Outcome: Adequate for Discharge   Problem: Elimination: Goal: Will not experience complications related to bowel motility Outcome: Adequate for Discharge Goal: Will not experience complications related to urinary retention Outcome: Adequate for Discharge   Problem: Pain Managment: Goal: General experience of comfort will improve Outcome:  Adequate for Discharge   Problem: Safety: Goal: Ability to remain free from injury will improve Outcome: Adequate for Discharge

## 2021-12-15 ENCOUNTER — Encounter (HOSPITAL_BASED_OUTPATIENT_CLINIC_OR_DEPARTMENT_OTHER): Payer: Self-pay | Admitting: Obstetrics and Gynecology

## 2021-12-22 ENCOUNTER — Other Ambulatory Visit: Payer: Self-pay

## 2021-12-22 DIAGNOSIS — E039 Hypothyroidism, unspecified: Secondary | ICD-10-CM

## 2021-12-22 MED ORDER — LEVOTHYROXINE SODIUM 100 MCG PO TABS: ORAL_TABLET | ORAL | 1 refills | Status: DC

## 2022-01-02 LAB — TSH: TSH: 0.33 — AB (ref <=5.90)

## 2022-01-06 ENCOUNTER — Encounter: Payer: Self-pay | Admitting: Internal Medicine

## 2022-01-07 LAB — HM DIABETES EYE EXAM

## 2022-01-12 ENCOUNTER — Encounter: Payer: Self-pay | Admitting: Internal Medicine

## 2022-01-16 ENCOUNTER — Encounter: Payer: Self-pay | Admitting: Internal Medicine

## 2022-01-29 ENCOUNTER — Encounter: Payer: Self-pay | Admitting: Internal Medicine

## 2022-02-02 ENCOUNTER — Other Ambulatory Visit: Payer: Self-pay

## 2022-02-02 MED ORDER — METFORMIN HCL 500 MG PO TABS: 500.0000 mg | ORAL_TABLET | Freq: Two times a day (BID) | ORAL | 0 refills | Status: DC

## 2022-02-11 ENCOUNTER — Encounter: Payer: Self-pay | Admitting: Internal Medicine

## 2022-02-11 ENCOUNTER — Ambulatory Visit (INDEPENDENT_AMBULATORY_CARE_PROVIDER_SITE_OTHER): Payer: 59 | Admitting: Internal Medicine

## 2022-02-11 VITALS — BP 140/68 | HR 76 | Temp 98.2°F | Ht 67.0 in | Wt 193.2 lb

## 2022-02-11 DIAGNOSIS — E559 Vitamin D deficiency, unspecified: Secondary | ICD-10-CM

## 2022-02-11 DIAGNOSIS — E6609 Other obesity due to excess calories: Secondary | ICD-10-CM

## 2022-02-11 DIAGNOSIS — E119 Type 2 diabetes mellitus without complications: Secondary | ICD-10-CM | POA: Diagnosis not present

## 2022-02-11 DIAGNOSIS — M1711 Unilateral primary osteoarthritis, right knee: Secondary | ICD-10-CM | POA: Diagnosis not present

## 2022-02-11 DIAGNOSIS — Z0000 Encounter for general adult medical examination without abnormal findings: Secondary | ICD-10-CM | POA: Diagnosis not present

## 2022-02-11 DIAGNOSIS — Z2821 Immunization not carried out because of patient refusal: Secondary | ICD-10-CM

## 2022-02-11 DIAGNOSIS — Z6830 Body mass index (BMI) 30.0-30.9, adult: Secondary | ICD-10-CM | POA: Diagnosis not present

## 2022-02-11 LAB — POCT URINALYSIS DIPSTICK
Bilirubin, UA: NEGATIVE
Blood, UA: NEGATIVE
Glucose, UA: NEGATIVE
Ketones, UA: NEGATIVE
Leukocytes, UA: NEGATIVE
Nitrite, UA: NEGATIVE
Protein, UA: NEGATIVE
Spec Grav, UA: <=1.005 — AB (ref 1.010–1.025)
Urobilinogen, UA: 0.2 E.U./dL
pH, UA: 5.5 (ref 5.0–8.0)

## 2022-02-11 NOTE — Patient Instructions (Signed)

## 2022-02-11 NOTE — Progress Notes (Signed)
Rich Brave [place],acting as a Education administrator for Maximino Greenland, MD.,have documented all relevant documentation on the behalf of Maximino Greenland, MD,as directed by  Maximino Greenland, MD while in the presence of Maximino Greenland, MD.   Subjective:     Patient ID: Kara Fry , female    DOB: [birth year] , 51 y.o.   MRN: [id number]   Chief Complaint  Patient presents with   Annual Exam    HPI  Patient presents today for her HM. Her husband was present for her exam. She is followed by Dr.Roberts for her GYN care. She is scheduled for a right total knee replacement on November 22nd.  She has been diagnosed with severe osteoarthritis, it has become quite painful for her to walk. She denies having chest pain, shortness of breath, palpitations. She is able to climb steps without difficulty (except for knee pain).      Past Medical History:  Diagnosis Date   History of anemia    Hypothyroidism    followed by pcp   OA (osteoarthritis)    SUI (stress urinary incontinence, female)    Type 2 diabetes mellitus ([place])    followed by pcp   (12-05-2021 per stated she does not check blood sugar)   Wears glasses      Family History  Problem Relation Age of Onset   Breast cancer Mother 1   Breast cancer Sister 43   Breast cancer Maternal Aunt    Breast cancer Maternal Grandmother      Current Outpatient Medications:    Cholecalciferol (VITAMIN D-3 PO), Take 1 capsule by mouth daily., Disp: , Rfl:    ibuprofen (ADVIL) 600 MG tablet, Take 1 tablet (600 mg total) by mouth every 6 (six) hours as needed., Disp: 30 tablet, Rfl: 0   levothyroxine (SYNTHROID) 100 MCG tablet, Take one tablet by mouth daily Monday through Sunday. (Patient taking differently: Take one tablet by mouth daily Monday through Saturday. Skip sundays), Disp: 30 tablet, Rfl: 1   metFORMIN (GLUCOPHAGE) 500 MG tablet, Take 1 tablet (500 mg total) by mouth 2 (two) times daily with a meal. (Patient taking differently: Take 500  mg by mouth 2 (two) times daily with a meal.), Disp: 60 tablet, Rfl: 3   Allergies  Allergen Reactions   Penicillins Shortness Of Breath and Swelling   Shellfish Allergy Shortness Of Breath and Swelling    ALL TYPES OF SHELLFISH     Review of Systems  Constitutional: Negative.   HENT: Negative.    Eyes: Negative.   Respiratory: Negative.    Cardiovascular: Negative.   Gastrointestinal: Negative.   Endocrine: Negative.   Genitourinary: Negative.   Musculoskeletal: Negative.   Skin: Negative.   Allergic/Immunologic: Negative.   Neurological: Negative.   Hematological: Negative.   Psychiatric/Behavioral: Negative.       Today's Vitals   02/11/22 0916  BP: (!) 140/68  Pulse: 76  Temp: 98.2 F (36.8 C)  Weight: 193 lb 3.2 oz (87.6 kg)  Height: $Remove'5\' 7"'LCiVhbY$  (1.702 m)  PainSc: 4   PainLoc: Knee   Body mass index is 30.26 kg/m.  Wt Readings from Last 3 Encounters:  02/11/22 193 lb 3.2 oz (87.6 kg)  12/12/21 188 lb 15 oz (85.7 kg)  12/03/21 194 lb (88 kg)     Objective:  Physical Exam Vitals and nursing note reviewed.  Constitutional:      Appearance: Normal appearance.  HENT:     Head: Normocephalic and atraumatic.  Right Ear: Tympanic membrane, ear canal and external ear normal.     Left Ear: Tympanic membrane, ear canal and external ear normal.     Nose:     Comments: Masked     Mouth/Throat:     Comments: Masked  Eyes:     Extraocular Movements: Extraocular movements intact.     Conjunctiva/sclera: Conjunctivae normal.     Pupils: Pupils are equal, round, and reactive to light.  Cardiovascular:     Rate and Rhythm: Normal rate and regular rhythm.     Pulses: Normal pulses.          Dorsalis pedis pulses are 2+ on the right side and 2+ on the left side.     Heart sounds: Normal heart sounds.  Pulmonary:     Effort: Pulmonary effort is normal.     Breath sounds: Normal breath sounds.  Chest:  Breasts:    Tanner Score is 5.     Right: Normal.     Left:  Normal.  Abdominal:     General: Abdomen is flat. Bowel sounds are normal.     Palpations: Abdomen is soft.  Genitourinary:    Comments: deferred Musculoskeletal:        General: Normal range of motion.     Cervical back: Normal range of motion and neck supple.  Feet:     Right foot:     Skin integrity: Skin integrity normal.     Toenail Condition: Right toenails are normal.     Left foot:     Skin integrity: Skin integrity normal.     Toenail Condition: Left toenails are normal.  Skin:    General: Skin is warm and dry.  Neurological:     General: No focal deficit present.     Mental Status: She is alert and oriented to person, place, and time.  Psychiatric:        Mood and Affect: Mood normal.        Behavior: Behavior normal.      Assessment And Plan:     1. Encounter for general adult medical examination w/o abnormal findings Comments: A full exam was performed. Importance of monthly self breast exams was discussed with the patient. PATIENT IS ADVISED TO GET 30-45 MINUTES REGULAR EXERCISE NO LESS THAN FOUR TO FIVE DAYS PER WEEK - BOTH WEIGHTBEARING EXERCISES AND AEROBIC ARE RECOMMENDED.  PATIENT IS ADVISED TO FOLLOW A HEALTHY DIET WITH AT LEAST SIX FRUITS/VEGGIES PER DAY, DECREASE INTAKE OF RED MEAT, AND TO INCREASE FISH INTAKE TO TWO DAYS PER WEEK.  MEATS/FISH SHOULD NOT BE FRIED, BAKED OR BROILED IS PREFERABLE.  IT IS ALSO IMPORTANT TO CUT BACK ON YOUR SUGAR INTAKE. PLEASE AVOID ANYTHING WITH ADDED SUGAR, CORN SYRUP OR OTHER SWEETENERS. IF YOU MUST USE A SWEETENER, YOU CAN TRY STEVIA. IT IS ALSO IMPORTANT TO AVOID ARTIFICIALLY SWEETENERS AND DIET BEVERAGES. LASTLY, I SUGGEST WEARING SPF 50 SUNSCREEN ON EXPOSED PARTS AND ESPECIALLY WHEN IN THE DIRECT SUNLIGHT FOR AN EXTENDED PERIOD OF TIME.  PLEASE AVOID FAST FOOD RESTAURANTS AND INCREASE YOUR WATER INTAKE. - CBC - CMP14+EGFR - Lipid panel - Hemoglobin A1c - TSH  2. Diabetes mellitus type II, non insulin dependent  ([place]) Comments: She will rto in 4 months for re-evaluation. She elects to control with lifestyle changes, will need to start meds if a1c>7.0. I DISCUSSED WITH THE PATIENT AT LENGTH REGARDING THE GOALS OF GLYCEMIC CONTROL AND POSSIBLE LONG-TERM COMPLICATIONS.  I  ALSO STRESSED THE IMPORTANCE OF  COMPLIANCE WITH HOME GLUCOSE MONITORING, DIETARY RESTRICTIONS INCLUDING AVOIDANCE OF SUGARY DRINKS/PROCESSED FOODS,  ALONG WITH REGULAR EXERCISE.  I  ALSO STRESSED THE IMPORTANCE OF ANNUAL EYE EXAMS, SELF FOOT CARE AND COMPLIANCE WITH OFFICE VISITS.  - Microalbumin / Creatinine Urine Ratio - POCT Urinalysis Dipstick (81002)  3. Primary osteoarthritis of right knee Comments: Chronic, she is able to proceed with TKR w/ acceptable risk.   4. Class 1 obesity due to excess calories with serious comorbidity and body mass index (BMI) of 30.0 to 30.9 in adult Comments: She is encouraged to aim for at least 150 minutes of exercise per week, once cleared by Ortho.   5. Influenza vaccination declined  6. Herpes zoster vaccination declined   Patient was given opportunity to ask questions. Patient verbalized understanding of the plan and was able to repeat key elements of the plan. All questions were answered to their satisfaction.   I, Maximino Greenland, MD, have reviewed all documentation for this visit. The documentation on 02/11/22 for the exam, diagnosis, procedures, and orders are all accurate and complete.   IF YOU HAVE BEEN REFERRED TO A SPECIALIST, IT MAY TAKE 1-2 WEEKS TO SCHEDULE/PROCESS THE REFERRAL. IF YOU HAVE NOT HEARD FROM US/SPECIALIST IN TWO WEEKS, PLEASE GIVE [place] A CALL AT [phone].   THE PATIENT IS ENCOURAGED TO PRACTICE SOCIAL DISTANCING DUE TO THE COVID-19 PANDEMIC.

## 2022-02-12 LAB — CMP14+EGFR
ALT: 17 IU/L (ref 0–32)
AST: 13 IU/L (ref 0–40)
Albumin/Globulin Ratio: 1.6 (ref 1.2–2.2)
Albumin: 4.4 g/dL (ref 3.8–4.9)
Alkaline Phosphatase: 95 IU/L (ref 44–121)
BUN/Creatinine Ratio: 12 (ref 9–23)
BUN: 11 mg/dL (ref 6–24)
Bilirubin Total: 0.5 mg/dL (ref 0.0–1.2)
CO2: 23 mmol/L (ref 20–29)
Calcium: 9.4 mg/dL (ref 8.7–10.2)
Chloride: 100 mmol/L (ref 96–106)
Creatinine, Ser: 0.93 mg/dL (ref 0.57–1.00)
Globulin, Total: 2.7 g/dL (ref 1.5–4.5)
Glucose: 85 mg/dL (ref 70–99)
Potassium: 4 mmol/L (ref 3.5–5.2)
Sodium: 138 mmol/L (ref 134–144)
Total Protein: 7.1 g/dL (ref 6.0–8.5)
eGFR: 74 mL/min/{1.73_m2} (ref >59)

## 2022-02-12 LAB — LIPID PANEL
Chol/HDL Ratio: 3 ratio (ref 0.0–4.4)
Cholesterol, Total: 230 mg/dL — ABNORMAL HIGH (ref 100–199)
HDL: 77 mg/dL (ref >39)
LDL Chol Calc (NIH): 142 mg/dL — ABNORMAL HIGH (ref 0–99)
Triglycerides: 62 mg/dL (ref 0–149)
VLDL Cholesterol Cal: 11 mg/dL (ref 5–40)

## 2022-02-12 LAB — CBC
Hematocrit: 37 % (ref 34.0–46.6)
Hemoglobin: 12.3 g/dL (ref 11.1–15.9)
MCH: 28.1 pg (ref 26.6–33.0)
MCHC: 33.2 g/dL (ref 31.5–35.7)
MCV: 85 fL (ref 79–97)
Platelets: 410 10*3/uL (ref 150–450)
RBC: 4.38 x10E6/uL (ref 3.77–5.28)
RDW: 14.1 % (ref 11.7–15.4)
WBC: 4.4 10*3/uL (ref 3.4–10.8)

## 2022-02-12 LAB — MICROALBUMIN / CREATININE URINE RATIO
Creatinine, Urine: 27.1 mg/dL
Microalb/Creat Ratio: <11 mg/g creat (ref 0–29)
Microalbumin, Urine: <3 ug/mL

## 2022-02-12 LAB — TSH: TSH: 4.2 u[IU]/mL (ref 0.450–4.500)

## 2022-02-12 LAB — HEMOGLOBIN A1C
Est. average glucose Bld gHb Est-mCnc: 134 mg/dL
Hgb A1c MFr Bld: 6.3 % — ABNORMAL HIGH (ref 4.8–5.6)

## 2022-03-17 ENCOUNTER — Other Ambulatory Visit: Payer: Self-pay

## 2022-03-17 DIAGNOSIS — E119 Type 2 diabetes mellitus without complications: Secondary | ICD-10-CM

## 2022-03-17 MED ORDER — METFORMIN HCL 500 MG PO TABS: 500.0000 mg | ORAL_TABLET | Freq: Two times a day (BID) | ORAL | 3 refills | Status: DC

## 2022-03-25 HISTORY — PX: TOTAL KNEE ARTHROPLASTY: SHX125

## 2022-05-06 ENCOUNTER — Other Ambulatory Visit: Payer: Self-pay

## 2022-05-06 DIAGNOSIS — E039 Hypothyroidism, unspecified: Secondary | ICD-10-CM

## 2022-05-06 MED ORDER — LEVOTHYROXINE SODIUM 100 MCG PO TABS: ORAL_TABLET | ORAL | 1 refills | Status: DC

## 2022-05-07 DIAGNOSIS — R2689 Other abnormalities of gait and mobility: Secondary | ICD-10-CM | POA: Diagnosis not present

## 2022-05-07 DIAGNOSIS — M25661 Stiffness of right knee, not elsewhere classified: Secondary | ICD-10-CM | POA: Diagnosis not present

## 2022-05-07 DIAGNOSIS — M1711 Unilateral primary osteoarthritis, right knee: Secondary | ICD-10-CM | POA: Diagnosis not present

## 2022-05-07 DIAGNOSIS — M25561 Pain in right knee: Secondary | ICD-10-CM | POA: Diagnosis not present

## 2022-05-11 DIAGNOSIS — M25561 Pain in right knee: Secondary | ICD-10-CM | POA: Diagnosis not present

## 2022-05-11 DIAGNOSIS — M1711 Unilateral primary osteoarthritis, right knee: Secondary | ICD-10-CM | POA: Diagnosis not present

## 2022-05-11 DIAGNOSIS — R2689 Other abnormalities of gait and mobility: Secondary | ICD-10-CM | POA: Diagnosis not present

## 2022-05-11 DIAGNOSIS — M25661 Stiffness of right knee, not elsewhere classified: Secondary | ICD-10-CM | POA: Diagnosis not present

## 2022-05-13 DIAGNOSIS — R2689 Other abnormalities of gait and mobility: Secondary | ICD-10-CM | POA: Diagnosis not present

## 2022-05-13 DIAGNOSIS — M25561 Pain in right knee: Secondary | ICD-10-CM | POA: Diagnosis not present

## 2022-05-13 DIAGNOSIS — M25661 Stiffness of right knee, not elsewhere classified: Secondary | ICD-10-CM | POA: Diagnosis not present

## 2022-05-13 DIAGNOSIS — M1711 Unilateral primary osteoarthritis, right knee: Secondary | ICD-10-CM | POA: Diagnosis not present

## 2022-05-20 DIAGNOSIS — M1711 Unilateral primary osteoarthritis, right knee: Secondary | ICD-10-CM | POA: Diagnosis not present

## 2022-05-20 DIAGNOSIS — M25661 Stiffness of right knee, not elsewhere classified: Secondary | ICD-10-CM | POA: Diagnosis not present

## 2022-05-20 DIAGNOSIS — M25561 Pain in right knee: Secondary | ICD-10-CM | POA: Diagnosis not present

## 2022-05-20 DIAGNOSIS — R2689 Other abnormalities of gait and mobility: Secondary | ICD-10-CM | POA: Diagnosis not present

## 2022-05-25 DIAGNOSIS — M1711 Unilateral primary osteoarthritis, right knee: Secondary | ICD-10-CM | POA: Diagnosis not present

## 2022-05-25 DIAGNOSIS — M25661 Stiffness of right knee, not elsewhere classified: Secondary | ICD-10-CM | POA: Diagnosis not present

## 2022-05-25 DIAGNOSIS — M25561 Pain in right knee: Secondary | ICD-10-CM | POA: Diagnosis not present

## 2022-05-25 DIAGNOSIS — R2689 Other abnormalities of gait and mobility: Secondary | ICD-10-CM | POA: Diagnosis not present

## 2022-06-03 DIAGNOSIS — R2689 Other abnormalities of gait and mobility: Secondary | ICD-10-CM | POA: Diagnosis not present

## 2022-06-03 DIAGNOSIS — M1711 Unilateral primary osteoarthritis, right knee: Secondary | ICD-10-CM | POA: Diagnosis not present

## 2022-06-03 DIAGNOSIS — M25561 Pain in right knee: Secondary | ICD-10-CM | POA: Diagnosis not present

## 2022-06-03 DIAGNOSIS — M25661 Stiffness of right knee, not elsewhere classified: Secondary | ICD-10-CM | POA: Diagnosis not present

## 2022-06-10 DIAGNOSIS — M25661 Stiffness of right knee, not elsewhere classified: Secondary | ICD-10-CM | POA: Diagnosis not present

## 2022-06-10 DIAGNOSIS — R2689 Other abnormalities of gait and mobility: Secondary | ICD-10-CM | POA: Diagnosis not present

## 2022-06-10 DIAGNOSIS — M25561 Pain in right knee: Secondary | ICD-10-CM | POA: Diagnosis not present

## 2022-06-10 DIAGNOSIS — M1711 Unilateral primary osteoarthritis, right knee: Secondary | ICD-10-CM | POA: Diagnosis not present

## 2022-06-15 ENCOUNTER — Encounter: Payer: Self-pay | Admitting: Internal Medicine

## 2022-06-15 ENCOUNTER — Ambulatory Visit (INDEPENDENT_AMBULATORY_CARE_PROVIDER_SITE_OTHER): Payer: BC Managed Care – PPO | Admitting: Internal Medicine

## 2022-06-15 VITALS — BP 130/70 | HR 98 | Temp 98.1°F | Ht 67.0 in | Wt 186.4 lb

## 2022-06-15 DIAGNOSIS — M1711 Unilateral primary osteoarthritis, right knee: Secondary | ICD-10-CM

## 2022-06-15 DIAGNOSIS — E663 Overweight: Secondary | ICD-10-CM | POA: Diagnosis not present

## 2022-06-15 DIAGNOSIS — Z6829 Body mass index (BMI) 29.0-29.9, adult: Secondary | ICD-10-CM

## 2022-06-15 DIAGNOSIS — E039 Hypothyroidism, unspecified: Secondary | ICD-10-CM | POA: Diagnosis not present

## 2022-06-15 DIAGNOSIS — E119 Type 2 diabetes mellitus without complications: Secondary | ICD-10-CM | POA: Diagnosis not present

## 2022-06-15 DIAGNOSIS — E1169 Type 2 diabetes mellitus with other specified complication: Secondary | ICD-10-CM | POA: Diagnosis not present

## 2022-06-15 DIAGNOSIS — Z2821 Immunization not carried out because of patient refusal: Secondary | ICD-10-CM

## 2022-06-15 NOTE — Progress Notes (Signed)
Barnet Glasgow Martin,acting as a Education administrator for Maximino Greenland, MD.,have documented all relevant documentation on the behalf of Maximino Greenland, MD,as directed by  Maximino Greenland, MD while in the presence of Maximino Greenland, MD.    Subjective:     Patient ID: Kara Fry , female    DOB: 18-Aug-1970 , 52 y.o.   MRN: [id number]   Chief Complaint  Patient presents with   Diabetes    HPI  The patient is here today for pre diabetes f/u. She has been taking metformin without any issues.  Patient states she had a right total knee replacement in November. She states she feels good, and she has no regrets about the surgery, she is pleased.   BP Readings from Last 3 Encounters: 06/15/22 : 130/70 02/11/22 : (!) 140/68 12/13/21 : (!) 134/92    Diabetes She presents for her follow-up diabetic visit. She has type 2 diabetes mellitus. Her disease course has been stable. There are no hypoglycemic associated symptoms. Pertinent negatives for diabetes include no blurred vision, no chest pain and no weight loss. There are no hypoglycemic complications. She is compliant with treatment all of the time.  Thyroid Problem Presents for follow-up visit. Patient reports no cold intolerance, constipation, depressed mood, hoarse voice or weight loss. The symptoms have been stable.     Past Medical History:  Diagnosis Date   History of anemia    Hypothyroidism    followed by pcp   OA (osteoarthritis)    SUI (stress urinary incontinence, female)    Type 2 diabetes mellitus ([place])    followed by pcp   (12-05-2021 per stated she does not check blood sugar)   Wears glasses      Family History  Problem Relation Age of Onset   Breast cancer Mother 33   Breast cancer Sister 62   Breast cancer Maternal Aunt    Breast cancer Maternal Grandmother      Current Outpatient Medications:    Cholecalciferol (VITAMIN D-3 PO), Take 1 capsule by mouth daily., Disp: , Rfl:    levothyroxine (SYNTHROID) 100 MCG  tablet, Take one tablet by mouth daily Monday through Saturday. Skip sundays, Disp: 30 tablet, Rfl: 1   metFORMIN (GLUCOPHAGE) 500 MG tablet, Take 1 tablet (500 mg total) by mouth 2 (two) times daily with a meal., Disp: 60 tablet, Rfl: 3   Allergies  Allergen Reactions   Penicillins Shortness Of Breath and Swelling   Shellfish Allergy Shortness Of Breath and Swelling    ALL TYPES OF SHELLFISH     Review of Systems  Constitutional: Negative.  Negative for weight loss.  HENT:  Negative for hoarse voice.   Eyes:  Negative for blurred vision.  Respiratory: Negative.    Cardiovascular: Negative.  Negative for chest pain.  Gastrointestinal:  Negative for constipation.  Endocrine: Negative for cold intolerance.  Neurological: Negative.   Psychiatric/Behavioral: Negative.       Today's Vitals   06/15/22 1128  BP: 130/70  Pulse: 98  Temp: 98.1 F (36.7 C)  TempSrc: Oral  Weight: 186 lb 6.4 oz (84.6 kg)  Height: 5' 7"$  (1.702 m)  PainSc: 4   PainLoc: Knee   Body mass index is 29.19 kg/m.  Wt Readings from Last 3 Encounters:  06/15/22 186 lb 6.4 oz (84.6 kg)  02/11/22 193 lb 3.2 oz (87.6 kg)  12/12/21 188 lb 15 oz (85.7 kg)    Objective:  Physical Exam Vitals and nursing note reviewed.  Constitutional:      Appearance: Normal appearance.  HENT:     Head: Normocephalic and atraumatic.     Nose:     Comments: Masked     Mouth/Throat:     Comments: masked Eyes:     Extraocular Movements: Extraocular movements intact.  Cardiovascular:     Rate and Rhythm: Normal rate and regular rhythm.     Heart sounds: Normal heart sounds.  Pulmonary:     Effort: Pulmonary effort is normal.     Breath sounds: Normal breath sounds.  Musculoskeletal:     Cervical back: Normal range of motion.  Skin:    General: Skin is warm.  Neurological:     General: No focal deficit present.     Mental Status: She is alert.  Psychiatric:        Mood and Affect: Mood normal.        Behavior:  Behavior normal.       Assessment And Plan:     1. Diabetes mellitus associated with hormonal etiology ([place]) Comments: Chronic, currently on metformin. I will check labs as below.  She will f/u in 4 months for re-evaluation. - Hemoglobin A1c - BMP8+eGFR  2. Acquired hypothyroidism Comments: Chronic, she feels well on her current regimen.  She will c/w Synthroid 161mg daily, except Sundays. - TSH  3. Primary osteoarthritis of right knee Comments: She is s/p R - TKR.  4. Overweight with body mass index (BMI) of 29 to 29.9 in adult Comments: She is encouraged to gradually increase daily activity under Ortho's guidance. She should work up to 150 minutes of exercise/week.  5. Herpes zoster vaccination declined   Patient was given opportunity to ask questions. Patient verbalized understanding of the plan and was able to repeat key elements of the plan. All questions were answered to their satisfaction.   I, RMaximino Greenland MD, have reviewed all documentation for this visit. The documentation on 06/15/22 for the exam, diagnosis, procedures, and orders are all accurate and complete.   IF YOU HAVE BEEN REFERRED TO A SPECIALIST, IT MAY TAKE 1-2 WEEKS TO SCHEDULE/PROCESS THE REFERRAL. IF YOU HAVE NOT HEARD FROM US/SPECIALIST IN TWO WEEKS, PLEASE GIVE [place] CALL AT [phone].   THE PATIENT IS ENCOURAGED TO PRACTICE SOCIAL DISTANCING DUE TO THE COVID-19 PANDEMIC.

## 2022-06-15 NOTE — Patient Instructions (Signed)

## 2022-06-16 LAB — HEMOGLOBIN A1C
Est. average glucose Bld gHb Est-mCnc: 120 mg/dL
Hgb A1c MFr Bld: 5.8 % — ABNORMAL HIGH (ref 4.8–5.6)

## 2022-06-16 LAB — TSH: TSH: 2.73 u[IU]/mL (ref 0.450–4.500)

## 2022-06-16 LAB — BMP8+EGFR
BUN/Creatinine Ratio: 12 (ref 9–23)
BUN: 11 mg/dL (ref 6–24)
CO2: 21 mmol/L (ref 20–29)
Calcium: 9.6 mg/dL (ref 8.7–10.2)
Chloride: 101 mmol/L (ref 96–106)
Creatinine, Ser: 0.91 mg/dL (ref 0.57–1.00)
Glucose: 86 mg/dL (ref 70–99)
Potassium: 4.2 mmol/L (ref 3.5–5.2)
Sodium: 140 mmol/L (ref 134–144)
eGFR: 75 mL/min/{1.73_m2} (ref >59)

## 2022-06-22 DIAGNOSIS — M1711 Unilateral primary osteoarthritis, right knee: Secondary | ICD-10-CM | POA: Diagnosis not present

## 2022-06-22 DIAGNOSIS — M25661 Stiffness of right knee, not elsewhere classified: Secondary | ICD-10-CM | POA: Diagnosis not present

## 2022-06-22 DIAGNOSIS — R2689 Other abnormalities of gait and mobility: Secondary | ICD-10-CM | POA: Diagnosis not present

## 2022-06-22 DIAGNOSIS — M25561 Pain in right knee: Secondary | ICD-10-CM | POA: Diagnosis not present

## 2022-06-26 DIAGNOSIS — M1711 Unilateral primary osteoarthritis, right knee: Secondary | ICD-10-CM | POA: Diagnosis not present

## 2022-07-02 DIAGNOSIS — M25661 Stiffness of right knee, not elsewhere classified: Secondary | ICD-10-CM | POA: Diagnosis not present

## 2022-07-02 DIAGNOSIS — M1711 Unilateral primary osteoarthritis, right knee: Secondary | ICD-10-CM | POA: Diagnosis not present

## 2022-07-02 DIAGNOSIS — M25561 Pain in right knee: Secondary | ICD-10-CM | POA: Diagnosis not present

## 2022-07-02 DIAGNOSIS — R2689 Other abnormalities of gait and mobility: Secondary | ICD-10-CM | POA: Diagnosis not present

## 2022-07-13 ENCOUNTER — Other Ambulatory Visit: Payer: Self-pay | Admitting: Internal Medicine

## 2022-07-13 DIAGNOSIS — E039 Hypothyroidism, unspecified: Secondary | ICD-10-CM

## 2022-08-08 ENCOUNTER — Other Ambulatory Visit: Payer: Self-pay | Admitting: Internal Medicine

## 2022-08-08 DIAGNOSIS — E119 Type 2 diabetes mellitus without complications: Secondary | ICD-10-CM

## 2022-08-08 DIAGNOSIS — E039 Hypothyroidism, unspecified: Secondary | ICD-10-CM

## 2022-10-08 ENCOUNTER — Other Ambulatory Visit: Payer: Self-pay | Admitting: Internal Medicine

## 2022-10-08 DIAGNOSIS — E039 Hypothyroidism, unspecified: Secondary | ICD-10-CM

## 2022-10-19 ENCOUNTER — Ambulatory Visit (INDEPENDENT_AMBULATORY_CARE_PROVIDER_SITE_OTHER): Payer: BC Managed Care – PPO | Admitting: Internal Medicine

## 2022-10-19 ENCOUNTER — Encounter: Payer: Self-pay | Admitting: Internal Medicine

## 2022-10-19 VITALS — BP 122/84 | HR 83 | Temp 97.7°F | Ht 67.0 in | Wt 185.8 lb

## 2022-10-19 DIAGNOSIS — Z6829 Body mass index (BMI) 29.0-29.9, adult: Secondary | ICD-10-CM | POA: Diagnosis not present

## 2022-10-19 DIAGNOSIS — Z23 Encounter for immunization: Secondary | ICD-10-CM

## 2022-10-19 DIAGNOSIS — E1169 Type 2 diabetes mellitus with other specified complication: Secondary | ICD-10-CM | POA: Diagnosis not present

## 2022-10-19 DIAGNOSIS — E1165 Type 2 diabetes mellitus with hyperglycemia: Secondary | ICD-10-CM | POA: Diagnosis not present

## 2022-10-19 DIAGNOSIS — E039 Hypothyroidism, unspecified: Secondary | ICD-10-CM | POA: Diagnosis not present

## 2022-10-19 DIAGNOSIS — Z79899 Other long term (current) drug therapy: Secondary | ICD-10-CM | POA: Diagnosis not present

## 2022-10-19 DIAGNOSIS — Z113 Encounter for screening for infections with a predominantly sexual mode of transmission: Secondary | ICD-10-CM | POA: Diagnosis not present

## 2022-10-19 MED ORDER — METFORMIN HCL 500 MG PO TABS: 500.0000 mg | ORAL_TABLET | Freq: Every day | ORAL | 1 refills | Status: DC

## 2022-10-19 NOTE — Patient Instructions (Signed)

## 2022-10-19 NOTE — Progress Notes (Unsigned)
Subjective:  Patient ID: Kara Fry , female    DOB: 06-Jun-1970 , 52 y.o.   MRN: [id number]  Chief Complaint  Patient presents with   Diabetes   Hypothyroidism    HPI  The patient is here today for diabetes & thyroid f/u. She reports compliance with medications. She has no issue with either medication. She admits being stressed caring for her husband who is currently beng treated for amyloidosis. He is being scheduled for stem cell transplant in the near future. She is concerned because as part of his workup, he tested pos for HSV-2. He had no known history of this. She has no known history of this as well. She wants to be checked today for HSV.   She reports having an appt scheduled tomorrow at Dr Osborn Coho office for pap/mammogram. She is scheduled to see the NP.      Diabetes She presents for her follow-up diabetic visit. She has type 2 diabetes mellitus. Her disease course has been stable. There are no hypoglycemic associated symptoms. Pertinent negatives for diabetes include no blurred vision, no chest pain and no weight loss. There are no hypoglycemic complications. She is compliant with treatment all of the time.  Thyroid Problem Presents for follow-up visit. Patient reports no cold intolerance, constipation, depressed mood, hoarse voice or weight loss. The symptoms have been stable.     Past Medical History:  Diagnosis Date   History of anemia    Hypothyroidism    followed by pcp   OA (osteoarthritis)    SUI (stress urinary incontinence, female)    Type 2 diabetes mellitus (HCC)    followed by pcp   (12-05-2021 per stated she does not check blood sugar)   Wears glasses      Family History  Problem Relation Age of Onset   Breast cancer Mother 53   Breast cancer Sister 47   Breast cancer Maternal Aunt    Breast cancer Maternal Grandmother      Current Outpatient Medications:    Cholecalciferol (VITAMIN D-3 PO), Take 1 capsule by mouth daily., Disp: ,  Rfl:    levothyroxine (SYNTHROID) 100 MCG tablet, Take one tablet by mouth daily Monday through Saturday. Skip sundays, Disp: 30 tablet, Rfl: 1   metFORMIN (GLUCOPHAGE) 500 MG tablet, Take 1 tablet (500 mg total) by mouth daily with breakfast., Disp: 90 tablet, Rfl: 1   Allergies  Allergen Reactions   Penicillins Shortness Of Breath and Swelling   Shellfish Allergy Shortness Of Breath and Swelling    ALL TYPES OF SHELLFISH     Review of Systems  Constitutional: Negative.  Negative for weight loss.  HENT:  Negative for hoarse voice.   Eyes:  Negative for blurred vision.  Respiratory: Negative.    Cardiovascular: Negative.  Negative for chest pain.  Gastrointestinal: Negative.  Negative for constipation.  Endocrine: Negative for cold intolerance.  Musculoskeletal: Negative.   Neurological: Negative.   Psychiatric/Behavioral: Negative.       Today's Vitals   10/19/22 1408  BP: 122/84  Pulse: 83  Temp: 97.7 F (36.5 C)  SpO2: 98%  Weight: 185 lb 12.8 oz (84.3 kg)  Height: 5\' 7"  (1.702 m)   Body mass index is 29.1 kg/m.  Wt Readings from Last 3 Encounters:  10/19/22 185 lb 12.8 oz (84.3 kg)  06/15/22 186 lb 6.4 oz (84.6 kg)  02/11/22 193 lb 3.2 oz (87.6 kg)    The 10-year ASCVD risk score (Arnett DK, et al., 2019) is:  4.1%   Values used to calculate the score:     Age: 76 years     Sex: Female     Is Non-Hispanic African American: Yes     Diabetic: Yes     Tobacco smoker: No     Systolic Blood Pressure: 122 mmHg     Is BP treated: No     HDL Cholesterol: 77 mg/dL     Total Cholesterol: 230 mg/dL  Objective:  Physical Exam Vitals and nursing note reviewed.  Constitutional:      Appearance: Normal appearance. She is obese.  HENT:     Head: Normocephalic and atraumatic.  Eyes:     Extraocular Movements: Extraocular movements intact.  Cardiovascular:     Rate and Rhythm: Normal rate and regular rhythm.     Heart sounds: Normal heart sounds.  Pulmonary:      Effort: Pulmonary effort is normal.     Breath sounds: Normal breath sounds.  Musculoskeletal:     Cervical back: Normal range of motion.  Skin:    General: Skin is warm.  Neurological:     General: No focal deficit present.     Mental Status: She is alert.  Psychiatric:        Mood and Affect: Mood normal.        Behavior: Behavior normal.         Assessment And Plan:  1. Type 2 diabetes mellitus with hyperglycemia, without long-term current use of insulin (HCC) Comments: She is currenty on metformin. I will check labs as below and adjust meds as needed. - Hemoglobin A1c - BMP8+eGFR - metFORMIN (GLUCOPHAGE) 500 MG tablet; Take 1 tablet (500 mg total) by mouth daily with breakfast.  Dispense: 90 tablet; Refill: 1  2. Acquired hypothyroidism Comments: Chronic, I will check thyroid panel and adjust meds as needed.  For now, she will c/w Synthroid M-Sat and skip Sundays. - TSH  3. Screening for STD (sexually transmitted disease) Comments: I will check HSV-1 and 2 type specific IGG. She is in agreement with testing. She will be able to discuss w/ NP tomorrow. I will also touch base w/ her GYN. - HSV 1 and 2 Ab, IgG  4. Body mass index (BMI) of 29.0 to 29.9 in adult Comments: She is encouraged to aim for at least 150 minutes of exercise/week.  5. Need for shingles vaccine Comments: She will f/u in 3-4 months for her next CPE. - Zoster Recombinant (Shingrix )    Return if symptoms worsen or fail to improve.  Patient was given opportunity to ask questions. Patient verbalized understanding of the plan and was able to repeat key elements of the plan. All questions were answered to their satisfaction.   I, Gwynneth Aliment, MD, have reviewed all documentation for this visit. The documentation on 10/19/22 for the exam, diagnosis, procedures, and orders are all accurate and complete.   IF YOU HAVE BEEN REFERRED TO A SPECIALIST, IT MAY TAKE 1-2 WEEKS TO SCHEDULE/PROCESS THE  REFERRAL. IF YOU HAVE NOT HEARD FROM US/SPECIALIST IN TWO WEEKS, PLEASE GIVE [place] A CALL AT [phone].

## 2022-10-20 ENCOUNTER — Encounter: Payer: Self-pay | Admitting: Internal Medicine

## 2022-10-20 ENCOUNTER — Ambulatory Visit: Payer: BC Managed Care – PPO | Admitting: Internal Medicine

## 2022-10-20 DIAGNOSIS — Z1231 Encounter for screening mammogram for malignant neoplasm of breast: Secondary | ICD-10-CM | POA: Diagnosis not present

## 2022-10-20 DIAGNOSIS — Z23 Encounter for immunization: Secondary | ICD-10-CM | POA: Diagnosis not present

## 2022-10-20 DIAGNOSIS — Z01419 Encounter for gynecological examination (general) (routine) without abnormal findings: Secondary | ICD-10-CM | POA: Diagnosis not present

## 2022-10-20 LAB — HEMOGLOBIN A1C
Est. average glucose Bld gHb Est-mCnc: 126 mg/dL
Hgb A1c MFr Bld: 6 % — ABNORMAL HIGH (ref 4.8–5.6)

## 2022-10-20 LAB — BMP8+EGFR
BUN/Creatinine Ratio: 10 (ref 9–23)
BUN: 10 mg/dL (ref 6–24)
CO2: 22 mmol/L (ref 20–29)
Calcium: 9.9 mg/dL (ref 8.7–10.2)
Chloride: 100 mmol/L (ref 96–106)
Creatinine, Ser: 0.98 mg/dL (ref 0.57–1.00)
Glucose: 91 mg/dL (ref 70–99)
Potassium: 4 mmol/L (ref 3.5–5.2)
Sodium: 139 mmol/L (ref 134–144)
eGFR: 69 mL/min/{1.73_m2} (ref >59)

## 2022-10-20 LAB — HSV 1 AND 2 AB, IGG
HSV 1 Glycoprotein G Ab, IgG: <0.91 index (ref 0.00–0.90)
HSV 2 IgG, Type Spec: <0.91 index (ref 0.00–0.90)

## 2022-10-20 LAB — HM MAMMOGRAPHY

## 2022-10-20 LAB — TSH: TSH: 1.73 u[IU]/mL (ref 0.450–4.500)

## 2022-11-25 DIAGNOSIS — M1711 Unilateral primary osteoarthritis, right knee: Secondary | ICD-10-CM | POA: Diagnosis not present

## 2022-12-02 ENCOUNTER — Other Ambulatory Visit: Payer: Self-pay | Admitting: Internal Medicine

## 2022-12-02 DIAGNOSIS — E039 Hypothyroidism, unspecified: Secondary | ICD-10-CM

## 2022-12-03 DIAGNOSIS — Z96651 Presence of right artificial knee joint: Secondary | ICD-10-CM | POA: Diagnosis not present

## 2022-12-03 DIAGNOSIS — T8484XA Pain due to internal orthopedic prosthetic devices, implants and grafts, initial encounter: Secondary | ICD-10-CM | POA: Diagnosis not present

## 2023-02-01 ENCOUNTER — Other Ambulatory Visit: Payer: Self-pay | Admitting: Internal Medicine

## 2023-02-01 DIAGNOSIS — E1165 Type 2 diabetes mellitus with hyperglycemia: Secondary | ICD-10-CM

## 2023-02-01 DIAGNOSIS — E039 Hypothyroidism, unspecified: Secondary | ICD-10-CM

## 2023-03-03 ENCOUNTER — Ambulatory Visit (INDEPENDENT_AMBULATORY_CARE_PROVIDER_SITE_OTHER): Payer: BC Managed Care – PPO | Admitting: Internal Medicine

## 2023-03-03 ENCOUNTER — Encounter: Payer: Self-pay | Admitting: Internal Medicine

## 2023-03-03 VITALS — BP 120/80 | HR 80 | Temp 97.7°F | Ht 67.0 in | Wt 183.8 lb

## 2023-03-03 DIAGNOSIS — E039 Hypothyroidism, unspecified: Secondary | ICD-10-CM

## 2023-03-03 DIAGNOSIS — Z2821 Immunization not carried out because of patient refusal: Secondary | ICD-10-CM

## 2023-03-03 DIAGNOSIS — L309 Dermatitis, unspecified: Secondary | ICD-10-CM

## 2023-03-03 DIAGNOSIS — Z0000 Encounter for general adult medical examination without abnormal findings: Secondary | ICD-10-CM | POA: Diagnosis not present

## 2023-03-03 DIAGNOSIS — E1165 Type 2 diabetes mellitus with hyperglycemia: Secondary | ICD-10-CM | POA: Diagnosis not present

## 2023-03-03 LAB — POCT URINALYSIS DIPSTICK
Bilirubin, UA: NEGATIVE
Blood, UA: NEGATIVE
Glucose, UA: NEGATIVE
Ketones, UA: NEGATIVE
Nitrite, UA: NEGATIVE
Protein, UA: NEGATIVE
Spec Grav, UA: 1.015 (ref 1.010–1.025)
Urobilinogen, UA: 0.2 U/dL
pH, UA: 5.5 (ref 5.0–8.0)

## 2023-03-03 MED ORDER — TRIAMCINOLONE ACETONIDE 0.1 % EX CREA: TOPICAL_CREAM | CUTANEOUS | 0 refills | Status: AC

## 2023-03-03 NOTE — Assessment & Plan Note (Signed)

## 2023-03-03 NOTE — Progress Notes (Signed)
I,Victoria T Deloria Lair, CMA,acting as a Neurosurgeon for Gwynneth Aliment, MD.,have documented all relevant documentation on the behalf of Gwynneth Aliment, MD,as directed by  Gwynneth Aliment, MD while in the presence of Gwynneth Aliment, MD.  Subjective:    Patient ID: Kara Fry , female    DOB: October 21, 1970 , 52 y.o.   MRN: [id number]  Chief Complaint  Patient presents with   Annual Exam   Diabetes   Hypothyroidism    HPI  Patient presents today for her annual exam. Her husband was present for her exam. She is followed by Dr.Roberts for her GYN care. She denies having chest pain, shortness of breath, and palpitations.   Letter sent for mammogram & pap result.   Diabetes She presents for her follow-up diabetic visit. She has type 2 diabetes mellitus. Her disease course has been stable. There are no hypoglycemic associated symptoms. There are no diabetic associated symptoms. There are no hypoglycemic complications. Current diabetic treatment includes oral agent (monotherapy).     Past Medical History:  Diagnosis Date   History of anemia    Hypothyroidism    followed by pcp   OA (osteoarthritis)    SUI (stress urinary incontinence, female)    Type 2 diabetes mellitus (HCC)    followed by pcp   (12-05-2021 per stated she does not check blood sugar)   Wears glasses      Family History  Problem Relation Age of Onset   Breast cancer Mother 52   Breast cancer Sister 49   Breast cancer Maternal Aunt    Breast cancer Maternal Grandmother      Current Outpatient Medications:    Cholecalciferol (VITAMIN D-3 PO), Take 1 capsule by mouth daily., Disp: , Rfl:    levothyroxine (SYNTHROID) 100 MCG tablet, TAKE ONE TABLET BY MOUTH DAILY Monday through Saturday. SKIP sundays, Disp: 30 tablet, Rfl: 1   metFORMIN (GLUCOPHAGE) 500 MG tablet, Take 1 tablet (500 mg total) by mouth daily with breakfast., Disp: 90 tablet, Rfl: 1   triamcinolone cream (KENALOG) 0.1 %, APPLY TO AFFECTED AREA TWICE  DAILY AS NEEDED, Disp: 30 g, Rfl: 0   Allergies  Allergen Reactions   Penicillins Shortness Of Breath and Swelling   Shellfish Allergy Shortness Of Breath and Swelling    ALL TYPES OF SHELLFISH      The patient states she uses none for birth control. Patient's last menstrual period was 12/21/2022 (approximate).. Negative for Dysmenorrhea. Negative for: breast discharge, breast lump(s), breast pain and breast self exam. Associated symptoms include abnormal vaginal bleeding. Pertinent negatives include abnormal bleeding (hematology), anxiety, decreased libido, depression, difficulty falling sleep, dyspareunia, history of infertility, nocturia, sexual dysfunction, sleep disturbances, urinary incontinence, urinary urgency, vaginal discharge and vaginal itching. Diet regular.The patient states her exercise level is    . The patient's tobacco use is:  Social History   Tobacco Use  Smoking Status Never  Smokeless Tobacco Never  . She has been exposed to passive smoke. The patient's alcohol use is:  Social History   Substance and Sexual Activity  Alcohol Use Never  .  Review of Systems  Constitutional: Negative.   HENT: Negative.    Eyes: Negative.   Respiratory: Negative.    Cardiovascular: Negative.   Gastrointestinal: Negative.   Endocrine: Negative.   Genitourinary: Negative.   Musculoskeletal: Negative.   Skin:  Positive for rash.       She is concerned about a rash on her left inner thigh. Wants to  know if it is due to herpes.   Allergic/Immunologic: Negative.   Neurological: Negative.   Psychiatric/Behavioral: Negative.       Today's Vitals   03/03/23 0941  BP: 120/80  Pulse: 80  Temp: 97.7 F (36.5 C)  SpO2: 98%  Weight: 183 lb 12.8 oz (83.4 kg)  Height: 5\' 7"  (1.702 m)   Body mass index is 28.79 kg/m.  Wt Readings from Last 3 Encounters:  03/03/23 183 lb 12.8 oz (83.4 kg)  10/19/22 185 lb 12.8 oz (84.3 kg)  06/15/22 186 lb 6.4 oz (84.6 kg)    The 10-year  ASCVD risk score (Arnett DK, et al., 2019) is: 4.4%   Values used to calculate the score:     Age: 59 years     Sex: Female     Is Non-Hispanic African American: Yes     Diabetic: Yes     Tobacco smoker: No     Systolic Blood Pressure: 120 mmHg     Is BP treated: No     HDL Cholesterol: 81 mg/dL     Total Cholesterol: 242 mg/dL   Objective:  Physical Exam Vitals and nursing note reviewed.  Constitutional:      Appearance: Normal appearance.  HENT:     Head: Normocephalic and atraumatic.     Right Ear: Tympanic membrane, ear canal and external ear normal.     Left Ear: Tympanic membrane, ear canal and external ear normal.     Nose: Nose normal.     Mouth/Throat:     Mouth: Mucous membranes are moist.     Pharynx: Oropharynx is clear.  Eyes:     Extraocular Movements: Extraocular movements intact.     Conjunctiva/sclera: Conjunctivae normal.     Pupils: Pupils are equal, round, and reactive to light.  Cardiovascular:     Rate and Rhythm: Normal rate and regular rhythm.     Pulses: Normal pulses.          Dorsalis pedis pulses are 2+ on the right side and 2+ on the left side.     Heart sounds: Normal heart sounds.  Pulmonary:     Effort: Pulmonary effort is normal.     Breath sounds: Normal breath sounds.  Chest:  Breasts:    Tanner Score is 5.     Comments: She kept bra on during exam Abdominal:     General: Abdomen is flat. Bowel sounds are normal.     Palpations: Abdomen is soft.  Genitourinary:    Comments: deferred Musculoskeletal:        General: Normal range of motion.     Cervical back: Normal range of motion and neck supple.  Feet:     Right foot:     Protective Sensation: 5 sites tested.  5 sites sensed.     Skin integrity: Skin integrity normal.     Toenail Condition: Right toenails are normal.     Left foot:     Protective Sensation: 5 sites tested.  5 sites sensed.     Skin integrity: Skin integrity normal.     Toenail Condition: Left toenails are  normal.  Skin:    General: Skin is warm and dry.     Comments: Hyperpigmented, scaly rash left inner thigh, no vesicular lesions noted. In dermatomal pattern  Neurological:     General: No focal deficit present.     Mental Status: She is alert and oriented to person, place, and time.  Psychiatric:  Mood and Affect: Mood normal.        Behavior: Behavior normal.         Assessment And Plan:     Encounter for general adult medical examination w/o abnormal findings Assessment & Plan: A full exam was performed.  Importance of monthly self breast exams was discussed with the patient.  She is advised to get 30-45 minutes of regular exercise, no less than four to five days per week. Both weight-bearing and aerobic exercises are recommended.  She is advised to follow a healthy diet with at least six fruits/veggies per day, decrease intake of red meat and other saturated fats and to increase fish intake to twice weekly.  Meats/fish should not be fried -- baked, boiled or broiled is preferable. It is also important to cut back on your sugar intake.  Be sure to read labels - try to avoid anything with added sugar, high fructose corn syrup or other sweeteners.  If you must use a sweetener, you can try stevia or monkfruit.  It is also important to avoid artificially sweetened foods/beverages and diet drinks. Lastly, wear SPF 50 sunscreen on exposed skin and when in direct sunlight for an extended period of time.  Be sure to avoid fast food restaurants and aim for at least 60 ounces of water daily.      Orders: -     CMP14+EGFR -     Lipid panel -     Hemoglobin A1c -     CBC -     TSH  Type 2 diabetes mellitus with hyperglycemia, without long-term current use of insulin (HCC) Assessment & Plan: Chronic, diabetic foot exam was performed. She will continue with metformin daily. Will adjust meds as needed.  I DISCUSSED WITH THE PATIENT AT LENGTH REGARDING THE GOALS OF GLYCEMIC CONTROL AND  POSSIBLE LONG-TERM COMPLICATIONS.  I  ALSO STRESSED THE IMPORTANCE OF COMPLIANCE WITH HOME GLUCOSE MONITORING, DIETARY RESTRICTIONS INCLUDING AVOIDANCE OF SUGARY DRINKS/PROCESSED FOODS,  ALONG WITH REGULAR EXERCISE.  I  ALSO STRESSED THE IMPORTANCE OF ANNUAL EYE EXAMS, SELF FOOT CARE AND COMPLIANCE WITH OFFICE VISITS.   Orders: -     POCT urinalysis dipstick -     Microalbumin / creatinine urine ratio -     EKG 12-Lead  Acquired hypothyroidism Assessment & Plan: Chronic, currently taking levothyroxine daily M-Sat, skipping Sundays. She feels well on this regimen.  I will check thyroid panel and adjust meds as needed.    Dermatitis Assessment & Plan: It is difficult to state this was due to shingles, all lesions have healed. No active vesicular lesions are visible at this time. Will check VZV serology today.   Orders: -     HSV and VZV PCR Panel  Immunization declined  Other orders -     Triamcinolone Acetonide; APPLY TO AFFECTED AREA TWICE DAILY AS NEEDED  Dispense: 30 g; Refill: 0     Return for 1 YEAR HM, 4 MONTH DM . Patient was given opportunity to ask questions. Patient verbalized understanding of the plan and was able to repeat key elements of the plan. All questions were answered to their satisfaction.   I, Gwynneth Aliment, MD, have reviewed all documentation for this visit. The documentation on 03/03/23 for the exam, diagnosis, procedures, and orders are all accurate and complete.

## 2023-03-03 NOTE — Assessment & Plan Note (Signed)
Chronic, currently taking levothyroxine daily M-Sat, skipping Sundays. She feels well on this regimen.  I will check thyroid panel and adjust meds as needed.

## 2023-03-03 NOTE — Patient Instructions (Signed)

## 2023-03-04 ENCOUNTER — Encounter: Payer: Self-pay | Admitting: Internal Medicine

## 2023-03-04 LAB — CMP14+EGFR
ALT: 12 [IU]/L (ref 0–32)
AST: 12 [IU]/L (ref 0–40)
Albumin: 4.7 g/dL (ref 3.8–4.9)
Alkaline Phosphatase: 98 [IU]/L (ref 44–121)
BUN/Creatinine Ratio: 11 (ref 9–23)
BUN: 11 mg/dL (ref 6–24)
Bilirubin Total: 0.7 mg/dL (ref 0.0–1.2)
CO2: 23 mmol/L (ref 20–29)
Calcium: 9.8 mg/dL (ref 8.7–10.2)
Chloride: 101 mmol/L (ref 96–106)
Creatinine, Ser: 1 mg/dL (ref 0.57–1.00)
Globulin, Total: 3.1 g/dL (ref 1.5–4.5)
Glucose: 83 mg/dL (ref 70–99)
Potassium: 3.8 mmol/L (ref 3.5–5.2)
Sodium: 141 mmol/L (ref 134–144)
Total Protein: 7.8 g/dL (ref 6.0–8.5)
eGFR: 67 mL/min/{1.73_m2} (ref >59)

## 2023-03-04 LAB — CBC
Hematocrit: 38.5 % (ref 34.0–46.6)
Hemoglobin: 12.3 g/dL (ref 11.1–15.9)
MCH: 27.1 pg (ref 26.6–33.0)
MCHC: 31.9 g/dL (ref 31.5–35.7)
MCV: 85 fL (ref 79–97)
Platelets: 384 10*3/uL (ref 150–450)
RBC: 4.54 x10E6/uL (ref 3.77–5.28)
RDW: 13.9 % (ref 11.7–15.4)
WBC: 4.5 10*3/uL (ref 3.4–10.8)

## 2023-03-04 LAB — HSV AND VZV PCR PANEL

## 2023-03-04 LAB — LIPID PANEL
Chol/HDL Ratio: 3 ratio (ref 0.0–4.4)
Cholesterol, Total: 242 mg/dL — ABNORMAL HIGH (ref 100–199)
HDL: 81 mg/dL (ref >39)
LDL Chol Calc (NIH): 149 mg/dL — ABNORMAL HIGH (ref 0–99)
Triglycerides: 69 mg/dL (ref 0–149)
VLDL Cholesterol Cal: 12 mg/dL (ref 5–40)

## 2023-03-04 LAB — HEMOGLOBIN A1C
Est. average glucose Bld gHb Est-mCnc: 131 mg/dL
Hgb A1c MFr Bld: 6.2 % — ABNORMAL HIGH (ref 4.8–5.6)

## 2023-03-04 LAB — MICROALBUMIN / CREATININE URINE RATIO
Creatinine, Urine: 101.8 mg/dL
Microalb/Creat Ratio: <3 mg/g{creat} (ref 0–29)
Microalbumin, Urine: <3 ug/mL

## 2023-03-04 LAB — TSH: TSH: 1.56 u[IU]/mL (ref 0.450–4.500)

## 2023-03-06 ENCOUNTER — Other Ambulatory Visit: Payer: Self-pay | Admitting: Internal Medicine

## 2023-03-06 DIAGNOSIS — L309 Dermatitis, unspecified: Secondary | ICD-10-CM

## 2023-03-14 NOTE — Assessment & Plan Note (Signed)
Chronic, diabetic foot exam was performed. She will continue with metformin daily. Will adjust meds as needed.  I DISCUSSED WITH THE PATIENT AT LENGTH REGARDING THE GOALS OF GLYCEMIC CONTROL AND POSSIBLE LONG-TERM COMPLICATIONS.  I  ALSO STRESSED THE IMPORTANCE OF COMPLIANCE WITH HOME GLUCOSE MONITORING, DIETARY RESTRICTIONS INCLUDING AVOIDANCE OF SUGARY DRINKS/PROCESSED FOODS,  ALONG WITH REGULAR EXERCISE.  I  ALSO STRESSED THE IMPORTANCE OF ANNUAL EYE EXAMS, SELF FOOT CARE AND COMPLIANCE WITH OFFICE VISITS.

## 2023-03-14 NOTE — Assessment & Plan Note (Signed)
It is difficult to state this was due to shingles, all lesions have healed. No active vesicular lesions are visible at this time. Will check VZV serology today.

## 2023-03-31 ENCOUNTER — Other Ambulatory Visit: Payer: Self-pay | Admitting: Internal Medicine

## 2023-03-31 DIAGNOSIS — E039 Hypothyroidism, unspecified: Secondary | ICD-10-CM

## 2023-04-05 ENCOUNTER — Encounter: Payer: Self-pay | Admitting: Internal Medicine

## 2023-06-07 ENCOUNTER — Other Ambulatory Visit: Payer: Self-pay | Admitting: Internal Medicine

## 2023-06-07 DIAGNOSIS — E039 Hypothyroidism, unspecified: Secondary | ICD-10-CM

## 2023-07-08 ENCOUNTER — Ambulatory Visit: Payer: BC Managed Care – PPO | Admitting: Internal Medicine

## 2023-07-08 ENCOUNTER — Encounter: Payer: Self-pay | Admitting: Internal Medicine

## 2023-07-08 VITALS — BP 130/84 | HR 79 | Temp 98.4°F | Ht 67.0 in | Wt 201.0 lb

## 2023-07-08 DIAGNOSIS — M1711 Unilateral primary osteoarthritis, right knee: Secondary | ICD-10-CM | POA: Diagnosis not present

## 2023-07-08 DIAGNOSIS — E66811 Obesity, class 1: Secondary | ICD-10-CM | POA: Diagnosis not present

## 2023-07-08 DIAGNOSIS — E039 Hypothyroidism, unspecified: Secondary | ICD-10-CM

## 2023-07-08 DIAGNOSIS — Z6831 Body mass index (BMI) 31.0-31.9, adult: Secondary | ICD-10-CM

## 2023-07-08 DIAGNOSIS — Z113 Encounter for screening for infections with a predominantly sexual mode of transmission: Secondary | ICD-10-CM

## 2023-07-08 DIAGNOSIS — R7303 Prediabetes: Secondary | ICD-10-CM | POA: Diagnosis not present

## 2023-07-08 DIAGNOSIS — E6609 Other obesity due to excess calories: Secondary | ICD-10-CM

## 2023-07-08 DIAGNOSIS — L309 Dermatitis, unspecified: Secondary | ICD-10-CM

## 2023-07-08 DIAGNOSIS — Z23 Encounter for immunization: Secondary | ICD-10-CM

## 2023-07-08 DIAGNOSIS — Z872 Personal history of diseases of the skin and subcutaneous tissue: Secondary | ICD-10-CM

## 2023-07-08 NOTE — Progress Notes (Signed)
 I,Victoria T Deloria Lair, CMA,acting as a Neurosurgeon for Gwynneth Aliment, MD.,have documented all relevant documentation on the behalf of Gwynneth Aliment, MD,as directed by  Gwynneth Aliment, MD while in the presence of Gwynneth Aliment, MD.  Subjective:  Patient ID: Kara Fry , female    DOB: [birth year] , 53 y.o.   MRN: [id number]  Chief Complaint  Patient presents with   Diabetes   Hypothyroidism    HPI  The patient is here today for diabetes & thyroid f/u. She reports compliance with medications. Denies headache, chest pain & sob. She has no specific questions or concerns.   Diabetes She presents for her follow-up diabetic visit. She has type 2 diabetes mellitus. Her disease course has been stable. There are no hypoglycemic associated symptoms. Pertinent negatives for diabetes include no blurred vision, no chest pain and no weight loss. There are no hypoglycemic complications. She is compliant with treatment all of the time.  Thyroid Problem Presents for follow-up visit. Patient reports no cold intolerance, constipation, depressed mood, hoarse voice or weight loss. The symptoms have been stable.     Past Medical History:  Diagnosis Date   History of anemia    Hypothyroidism    followed by pcp   OA (osteoarthritis)    SUI (stress urinary incontinence, female)    Type 2 diabetes mellitus (HCC)    followed by pcp   (12-05-2021 per stated she does not check blood sugar)   Wears glasses      Family History  Problem Relation Age of Onset   Breast cancer Mother 70   Breast cancer Sister 24   Breast cancer Maternal Aunt    Breast cancer Maternal Grandmother      Current Outpatient Medications:    Cholecalciferol (VITAMIN D-3 PO), Take 1 capsule by mouth daily., Disp: , Rfl:    levothyroxine (SYNTHROID) 100 MCG tablet, TAKE ONE TABLET BY MOUTH DAILY MONDAY - SATURDAY. SKIP SUNDAY, Disp: 30 tablet, Rfl: 1   metFORMIN (GLUCOPHAGE) 500 MG tablet, TAKE 1 TABLET TWICE DAILY., Disp: 90  tablet, Rfl: 1   triamcinolone cream (KENALOG) 0.1 %, APPLY TO AFFECTED AREA TWICE DAILY AS NEEDED (Patient not taking: Reported on 07/08/2023), Disp: 30 g, Rfl: 0   Allergies  Allergen Reactions   Penicillins Shortness Of Breath and Swelling   Shellfish Allergy Shortness Of Breath and Swelling    ALL TYPES OF SHELLFISH     Review of Systems  Constitutional: Negative.  Negative for weight loss.  HENT:  Negative for hoarse voice.   Eyes:  Negative for blurred vision.  Respiratory: Negative.    Cardiovascular: Negative.  Negative for chest pain.  Gastrointestinal: Negative.  Negative for constipation.  Endocrine: Negative for cold intolerance.  Neurological: Negative.   Psychiatric/Behavioral: Negative.       Today's Vitals   07/08/23 1052  BP: 130/84  Pulse: 79  Temp: 98.4 F (36.9 C)  SpO2: 98%  Weight: 201 lb (91.2 kg)  Height: 5\' 7"  (1.702 m)   Body mass index is 31.48 kg/m.  Wt Readings from Last 3 Encounters:  07/08/23 201 lb (91.2 kg)  03/03/23 183 lb 12.8 oz (83.4 kg)  10/19/22 185 lb 12.8 oz (84.3 kg)     Objective:  Physical Exam Vitals and nursing note reviewed.  Constitutional:      Appearance: Normal appearance.  HENT:     Head: Normocephalic and atraumatic.  Eyes:     Extraocular Movements: Extraocular movements intact.  Cardiovascular:  Rate and Rhythm: Normal rate and regular rhythm.     Heart sounds: Normal heart sounds.  Pulmonary:     Effort: Pulmonary effort is normal.     Breath sounds: Normal breath sounds.  Musculoskeletal:     Cervical back: Normal range of motion.  Skin:    General: Skin is warm.     Comments: Hyperpigmented scarring left inner thigh  Neurological:     General: No focal deficit present.     Mental Status: She is alert.  Psychiatric:        Mood and Affect: Mood normal.        Behavior: Behavior normal.         Assessment And Plan:  Prediabetes Assessment & Plan: Upon review of her chart, she has had only  one reading w/ A1c greater or equal to 6.5%. Will update diagnosis to prediabetes. She will continue with metformin once daily.  She will f/u in four months.   Orders: -     CMP14+EGFR -     Hemoglobin A1c  Acquired hypothyroidism Assessment & Plan: Chronic, currently taking levothyroxine daily M-Sat, skipping Sundays. She feels well on this regimen. Most recent labs reviewed, will recheck thyroid labs at her next visit.    Primary osteoarthritis of right knee Assessment & Plan: Chronic, this has affected her ability to exercise on a regular basis. She may benefit from water exercises.    Class 1 obesity due to excess calories with serious comorbidity and body mass index (BMI) of 31.0 to 31.9 in adult Assessment & Plan: She is encouraged to strive for BMI less than 30 to decrease cardiac risk. Advised to aim for at least 150 minutes of exercise per week.    History of dermatitis Assessment & Plan: It is difficult to state this was due to shingles, all lesions have healed. No active vesicular lesions are visible at this time. Will check VZV serology today.   Orders: -     Varicella zoster antibody, IgG -     HSV-2 Ab, IgG  Screening examination for STI -     HSV-2 Ab, IgG  She is encouraged to strive for BMI less than 30 to decrease cardiac risk. Advised to aim for at least 150 minutes of exercise per week.    Return for 4 month dm f/u. Marland Kitchen  Patient was given opportunity to ask questions. Patient verbalized understanding of the plan and was able to repeat key elements of the plan. All questions were answered to their satisfaction.    I, Gwynneth Aliment, MD, have reviewed all documentation for this visit. The documentation on 07/08/23 for the exam, diagnosis, procedures, and orders are all accurate and complete.   IF YOU HAVE BEEN REFERRED TO A SPECIALIST, IT MAY TAKE 1-2 WEEKS TO SCHEDULE/PROCESS THE REFERRAL. IF YOU HAVE NOT HEARD FROM US/SPECIALIST IN TWO WEEKS, PLEASE  GIVE [place] A CALL AT [phone].   THE PATIENT IS ENCOURAGED TO PRACTICE SOCIAL DISTANCING DUE TO THE COVID-19 PANDEMIC.

## 2023-07-08 NOTE — Patient Instructions (Signed)

## 2023-07-09 ENCOUNTER — Encounter: Payer: Self-pay | Admitting: Internal Medicine

## 2023-07-09 LAB — CMP14+EGFR
ALT: 16 IU/L (ref 0–32)
AST: 19 IU/L (ref 0–40)
Albumin: 4.5 g/dL (ref 3.8–4.9)
Alkaline Phosphatase: 106 IU/L (ref 44–121)
BUN/Creatinine Ratio: 10 (ref 9–23)
BUN: 10 mg/dL (ref 6–24)
Bilirubin Total: 0.6 mg/dL (ref 0.0–1.2)
CO2: 23 mmol/L (ref 20–29)
Calcium: 9.7 mg/dL (ref 8.7–10.2)
Chloride: 103 mmol/L (ref 96–106)
Creatinine, Ser: 1.01 mg/dL — ABNORMAL HIGH (ref 0.57–1.00)
Globulin, Total: 3.2 g/dL (ref 1.5–4.5)
Glucose: 82 mg/dL (ref 70–99)
Potassium: 3.9 mmol/L (ref 3.5–5.2)
Sodium: 141 mmol/L (ref 134–144)
Total Protein: 7.7 g/dL (ref 6.0–8.5)
eGFR: 66 mL/min/{1.73_m2} (ref >59)

## 2023-07-09 LAB — VARICELLA ZOSTER ANTIBODY, IGG: Varicella zoster IgG: REACTIVE

## 2023-07-09 LAB — HEMOGLOBIN A1C
Est. average glucose Bld gHb Est-mCnc: 134 mg/dL
Hgb A1c MFr Bld: 6.3 % — ABNORMAL HIGH (ref 4.8–5.6)

## 2023-07-09 LAB — HSV-2 AB, IGG: HSV 2 IgG, Type Spec: NONREACTIVE

## 2023-07-13 ENCOUNTER — Encounter: Payer: Self-pay | Admitting: Internal Medicine

## 2023-07-15 ENCOUNTER — Other Ambulatory Visit: Payer: Self-pay

## 2023-07-15 DIAGNOSIS — E1165 Type 2 diabetes mellitus with hyperglycemia: Secondary | ICD-10-CM

## 2023-07-15 MED ORDER — METFORMIN HCL 500 MG PO TABS: ORAL_TABLET | ORAL | 1 refills | Status: DC

## 2023-07-17 DIAGNOSIS — M1711 Unilateral primary osteoarthritis, right knee: Secondary | ICD-10-CM | POA: Insufficient documentation

## 2023-07-17 DIAGNOSIS — E6609 Other obesity due to excess calories: Secondary | ICD-10-CM | POA: Insufficient documentation

## 2023-07-17 NOTE — Assessment & Plan Note (Signed)
 It is difficult to state this was due to shingles, all lesions have healed. No active vesicular lesions are visible at this time. Will check VZV serology today.

## 2023-07-17 NOTE — Assessment & Plan Note (Signed)
 Upon review of her chart, she has had only one reading w/ A1c greater or equal to 6.5%. Will update diagnosis to prediabetes. She will continue with metformin once daily.  She will f/u in four months.

## 2023-07-17 NOTE — Assessment & Plan Note (Signed)
 She is encouraged to strive for BMI less than 30 to decrease cardiac risk. Advised to aim for at least 150 minutes of exercise per week.

## 2023-07-17 NOTE — Assessment & Plan Note (Signed)
 Chronic, this has affected her ability to exercise on a regular basis. She may benefit from water exercises.

## 2023-07-17 NOTE — Assessment & Plan Note (Addendum)
 Chronic, currently taking levothyroxine daily M-Sat, skipping Sundays. She feels well on this regimen. Most recent labs reviewed, will recheck thyroid labs at her next visit.

## 2023-07-25 ENCOUNTER — Other Ambulatory Visit: Payer: Self-pay | Admitting: Internal Medicine

## 2023-07-25 DIAGNOSIS — E1165 Type 2 diabetes mellitus with hyperglycemia: Secondary | ICD-10-CM

## 2023-07-25 MED ORDER — METFORMIN HCL 500 MG PO TABS: ORAL_TABLET | ORAL | 1 refills | Status: DC

## 2023-08-04 ENCOUNTER — Other Ambulatory Visit: Payer: Self-pay | Admitting: Internal Medicine

## 2023-08-04 DIAGNOSIS — E039 Hypothyroidism, unspecified: Secondary | ICD-10-CM

## 2023-11-15 ENCOUNTER — Encounter: Payer: Self-pay | Admitting: Internal Medicine

## 2023-11-15 ENCOUNTER — Ambulatory Visit: Admitting: Internal Medicine

## 2023-11-15 VITALS — BP 120/78 | HR 95 | Temp 97.6°F | Ht 67.0 in | Wt 196.0 lb

## 2023-11-15 DIAGNOSIS — Z8249 Family history of ischemic heart disease and other diseases of the circulatory system: Secondary | ICD-10-CM | POA: Diagnosis not present

## 2023-11-15 DIAGNOSIS — R7303 Prediabetes: Secondary | ICD-10-CM

## 2023-11-15 DIAGNOSIS — Z6830 Body mass index (BMI) 30.0-30.9, adult: Secondary | ICD-10-CM

## 2023-11-15 DIAGNOSIS — E039 Hypothyroidism, unspecified: Secondary | ICD-10-CM

## 2023-11-15 DIAGNOSIS — E66811 Obesity, class 1: Secondary | ICD-10-CM

## 2023-11-15 DIAGNOSIS — Z96651 Presence of right artificial knee joint: Secondary | ICD-10-CM

## 2023-11-15 DIAGNOSIS — E6609 Other obesity due to excess calories: Secondary | ICD-10-CM

## 2023-11-15 NOTE — Progress Notes (Signed)
 I,[name], CMA,acting as a Neurosurgeon for [name], MD.,have documented all relevant documentation on the behalf of [name], MD,as directed by  [name], MD while in the presence of [name], MD.  Subjective:  Patient ID: Kara Fry , female    DOB: [birth year] , 53 y.o.   MRN: [id number]  Chief Complaint  Patient presents with   Diabetes    Patients presents today for predm  & thyroid  follow up. She reports compliance with medications. Denies headache, chest pain & sob. She will call Dr Henry to schedule pap & mammogram.     Hypothyroidism    HPI Discussed the use of AI scribe software for clinical note transcription with the patient, who gave verbal consent to proceed.  History of Present Illness Kara Fry is a 53 year old female with diabetes and thyroid  issues who presents for management of these conditions.  She has a history of diabetes and currently believes her condition is in a prediabetic state, except for one high reading during a visit with a nurse practitioner, which led to the initiation of metformin . She wants the diabetes diagnosis removed from her chart, as subsequent readings have been in the prediabetic range.  She is on thyroid  medication but only has enough for the rest of the week. She is currently taking Synthroid  100mcg M-Sat, skipping Sundays.   She underwent total knee replacement surgery in 2023 but reports ongoing issues with swelling and discomfort. She feels that the surgery did not resolve her symptoms, as she continues to experience the same problems as before, including the knee giving out and discomfort with certain movements. Bone scans and MRIs post-surgery showed no abnormalities, and she has not tried any new treatments like Voltaren cream.  Her family history is significant for heart disease and stroke, as her mother had several heart attacks and a stroke.   Thyroid  Problem Presents for follow-up  visit. Patient reports no cold intolerance, constipation, depressed mood, hoarse voice or weight loss. The symptoms have been stable.     Past Medical History:  Diagnosis Date   History of anemia    Hypothyroidism    followed by pcp   OA (osteoarthritis)    SUI (stress urinary incontinence, female)    Type 2 diabetes mellitus (HCC)    followed by pcp   (12-05-2021 per stated she does not check blood sugar)   Wears glasses      Family History  Problem Relation Age of Onset   Breast cancer Mother 50   Breast cancer Sister 46   Breast cancer Maternal Aunt    Breast cancer Maternal Grandmother      Current Outpatient Medications:    Cholecalciferol (VITAMIN D -3 PO), Take 1 capsule by mouth daily., Disp: , Rfl:    levothyroxine  (SYNTHROID ) 100 MCG tablet, TAKE ONE TABLET BY MOUTH DAILY MONDAY THROUGH SATURDAY. **skip SUNDAY, Disp: 30 tablet, Rfl: 1   metFORMIN  (GLUCOPHAGE ) 500 MG tablet, TAKE 1 TABLET TWICE DAILY., Disp: 180 tablet, Rfl: 1   triamcinolone  cream (KENALOG ) 0.1 %, APPLY TO AFFECTED AREA TWICE DAILY AS NEEDED, Disp: 30 g, Rfl: 0   Allergies  Allergen Reactions   Penicillins Shortness Of Breath and Swelling   Shellfish Allergy Shortness Of Breath and Swelling    ALL TYPES OF SHELLFISH     Review of Systems  Constitutional: Negative.  Negative for weight loss.  HENT:  Negative for hoarse voice.   Respiratory: Negative.  Cardiovascular: Negative.   Gastrointestinal: Negative.  Negative for constipation.  Endocrine: Negative for cold intolerance.  Neurological: Negative.   Psychiatric/Behavioral: Negative.       Today's Vitals   11/15/23 1107  BP: 120/78  Pulse: 95  Temp: 97.6 F (36.4 C)  SpO2: 98%  Weight: 196 lb (88.9 kg)  Height: 5' 7 (1.702 m)   Body mass index is 30.7 kg/m.  Wt Readings from Last 3 Encounters:  11/15/23 196 lb (88.9 kg)  07/08/23 201 lb (91.2 kg)  03/03/23 183 lb 12.8 oz (83.4 kg)     Objective:  Physical Exam Vitals  and nursing note reviewed.  Constitutional:      Appearance: Normal appearance.  HENT:     Head: Normocephalic and atraumatic.  Eyes:     Extraocular Movements: Extraocular movements intact.  Cardiovascular:     Rate and Rhythm: Normal rate and regular rhythm.     Heart sounds: Normal heart sounds.  Pulmonary:     Effort: Pulmonary effort is normal.     Breath sounds: Normal breath sounds.  Musculoskeletal:     Cervical back: Normal range of motion.  Skin:    General: Skin is warm.  Neurological:     General: No focal deficit present.     Mental Status: She is alert.  Psychiatric:        Mood and Affect: Mood normal.        Behavior: Behavior normal.         Assessment And Plan:  Prediabetes Assessment & Plan: Previous labs reviewed, her A1c has been elevated in the past. I will check an A1c today. Reminded to avoid refined sugars including sugary drinks/foods and processed meats including bacon, sausages and deli meats.    Orders: -     Hemoglobin A1c -     BMP8+EGFR  Acquired hypothyroidism Assessment & Plan: Chronic, currently taking levothyroxine  100mcg daily M-Sat, skipping Sundays. She feels well on this regimen. Most recent labs reviewed, will recheck thyroid  labs at her next visit.   Orders: -     CBC -     Lipid panel  Class 1 obesity due to excess calories with serious comorbidity and body mass index (BMI) of 30.0 to 30.9 in adult Assessment & Plan: She is encouraged to strive for BMI less than 27 to decrease cardiac risk. Advised to aim for at least 150 minutes of exercise per week.    History of right knee joint replacement Assessment & Plan: Persistent swelling and discomfort post-surgery with normal imaging. Considering second opinion. - Recommend over-the-counter Voltaren cream for swelling. - Encourage seeking a second opinion from another orthopedic specialist.   Family history of heart disease -     Lipoprotein A (LPA)  Return if symptoms  worsen or fail to improve.  Patient was given opportunity to ask questions. Patient verbalized understanding of the plan and was able to repeat key elements of the plan. All questions were answered to their satisfaction.   I, [name], MD, have reviewed all documentation for this visit. The documentation on 11/15/23 for the exam, diagnosis, procedures, and orders are all accurate and complete.   IF YOU HAVE BEEN REFERRED TO A SPECIALIST, IT MAY TAKE 1-2 WEEKS TO SCHEDULE/PROCESS THE REFERRAL. IF YOU HAVE NOT HEARD FROM US /SPECIALIST IN TWO WEEKS, PLEASE GIVE US  A CALL AT [phone].   THE PATIENT IS ENCOURAGED TO PRACTICE SOCIAL DISTANCING DUE TO THE COVID-19 PANDEMIC.

## 2023-11-15 NOTE — Patient Instructions (Addendum)
 Prediabetes: What to Know Prediabetes is when your blood sugar, also called glucose, is at a higher level than normal but not high enough for you to be diagnosed with type 2 diabetes (type 2 diabetes mellitus). Having prediabetes puts you at risk for getting type 2 diabetes. By making some healthy changes, you may be able to prevent or delay getting type 2 diabetes. This is important because type 2 diabetes can lead to serious problems. Some of these include: Heart disease. Stroke. Blindness. Kidney disease. Depression. Poor blood flow in the feet and legs. In very bad cases, this could lead to having a leg removed by surgery (amputation). What are the causes? The exact cause of prediabetes isn't known. It may result from insulin resistance. Insulin resistance happens when cells in the body don't respond properly to insulin that the body makes. This can cause too much sugar to build up in the blood. High blood sugar, also called hyperglycemia, can develop. What increases the risk? Having a family member with type 2 diabetes. Being older than 55 years of age. Having had a temporary form of diabetes during a pregnancy. This is called gestational diabetes. Having had polycystic ovary syndrome (PCOS). Being overweight or obese. Being inactive and not getting much exercise. Having a history of heart disease. This may include problems with cholesterol levels, high levels of blood fats, or high blood pressure. What are the signs or symptoms? You may have no symptoms. If you do have symptoms, they may include: Increased hunger. Increased thirst. Needing to pee more often. Changes in how you see, like blurry vision. Feeling tired. How is this diagnosed? Prediabetes can be diagnosed with blood tests that check your blood sugar. One or more of these tests may be done: A fasting blood glucose (FBG) test. You won't be allowed to eat (you will fast) for at least 8 hours before a blood sample is  taken. An A1C blood test, also called a hemoglobin A1C test. This test shows information about blood sugar levels over the past 2?3 months. An oral glucose tolerance test (OGTT). This test measures your blood sugar at two points in time: After you haven't eaten for a while. This is your baseline level. Two hours after you drink a beverage that has sugar in it. You may be diagnosed with prediabetes if: Your FBG is 100?125 mg/dL (1.6-1.0 mmol/L). Your A1C level is 5.7?6.4% (39-46 mmol/mol). Your OGTT result is 140?199 mg/dL (9.6-04 mmol/L). These blood tests may need to be done again to be sure of the diagnosis. How is this treated? Treatment may include making changes to your diet and lifestyle. These changes can help lower your blood sugar and keep you from getting type 2 diabetes. In some cases, medicine may be given to help lower your risk. Follow these instructions at home: Eating and drinking  Eat and drink as told. Follow a healthy meal plan. This includes eating lean proteins, whole grains, legumes, fresh fruits and vegetables, low-fat dairy products, and healthy fats. Meet with an expert in healthy eating called a dietitian. This person can help create a healthy eating plan that's right for you. Lifestyle Do moderate-intensity exercise. Do this for at least 30 minutes a day on 5 or more days each week, or as told by your health care provider. A mix of activities may be best. Good choices include brisk walking, swimming, biking, and weight lifting. Try to lose weight if your provider says it's OK. Losing 5-7% of your body weight can  help reverse insulin resistance. Do not drink alcohol if: Your provider tells you not to drink. You're pregnant, may be pregnant, or plan to become pregnant. If you drink alcohol: Limit how much you have to: 0-1 drink a day if you're female. 0-2 drinks a day if you're female. Know how much alcohol is in your drink. In the [place] one drink is one 12 oz  bottle of beer (355 mL), one 5 oz glass of wine (148 mL), or one 1 oz glass of hard liquor (44 mL). General instructions Take medicines only as told. You may be given medicines that help lower the risk of type 2 diabetes. Do not smoke, vape, or use nicotine or tobacco. Where to find more information American Diabetes Association: diabetes.org/about-diabetes/prediabetes Academy of Nutrition and Dietetics: eatright.org American Heart Association: Go to ThisJobs.cz. Click the search icon. Type "prediabetes" in the search box. Contact a health care provider if: You have any of these symptoms: Increased hunger. Peeing more often than usual. Increased thirst. Feeling tired. Changes in how you see, like blurry vision. Feeling like you may throw up. Throwing up. Get help right away if: You have shortness of breath. You feel confused. This information is not intended to replace advice given to you by your health care provider. Make sure you discuss any questions you have with your health care provider. Document Revised: 11/22/2022 Document Reviewed: 11/22/2022 Elsevier Patient Education  2024 [employer].

## 2023-11-16 ENCOUNTER — Ambulatory Visit: Payer: Self-pay | Admitting: Internal Medicine

## 2023-11-16 LAB — CBC
Hematocrit: 37.2 % (ref 34.0–46.6)
Hemoglobin: 12 g/dL (ref 11.1–15.9)
MCH: 27.3 pg (ref 26.6–33.0)
MCHC: 32.3 g/dL (ref 31.5–35.7)
MCV: 85 fL (ref 79–97)
Platelets: 368 x10E3/uL (ref 150–450)
RBC: 4.39 x10E6/uL (ref 3.77–5.28)
RDW: 14.6 % (ref 11.7–15.4)
WBC: 3.9 x10E3/uL (ref 3.4–10.8)

## 2023-11-16 LAB — BMP8+EGFR
BUN/Creatinine Ratio: 9 (ref 9–23)
BUN: 8 mg/dL (ref 6–24)
CO2: 21 mmol/L (ref 20–29)
Calcium: 9.4 mg/dL (ref 8.7–10.2)
Chloride: 101 mmol/L (ref 96–106)
Creatinine, Ser: 0.9 mg/dL (ref 0.57–1.00)
Glucose: 96 mg/dL (ref 70–99)
Potassium: 4.1 mmol/L (ref 3.5–5.2)
Sodium: 140 mmol/L (ref 134–144)
eGFR: 76 mL/min/1.73 (ref >59)

## 2023-11-16 LAB — LIPID PANEL
Chol/HDL Ratio: 3.1 ratio (ref 0.0–4.4)
Cholesterol, Total: 223 mg/dL — ABNORMAL HIGH (ref 100–199)
HDL: 73 mg/dL (ref >39)
LDL Chol Calc (NIH): 140 mg/dL — ABNORMAL HIGH (ref 0–99)
Triglycerides: 60 mg/dL (ref 0–149)
VLDL Cholesterol Cal: 10 mg/dL (ref 5–40)

## 2023-11-16 LAB — HEMOGLOBIN A1C
Est. average glucose Bld gHb Est-mCnc: 134 mg/dL
Hgb A1c MFr Bld: 6.3 % — ABNORMAL HIGH (ref 4.8–5.6)

## 2023-11-16 LAB — LIPOPROTEIN A (LPA): Lipoprotein (a): 62.6 nmol/L (ref <75.0)

## 2023-11-21 DIAGNOSIS — Z96651 Presence of right artificial knee joint: Secondary | ICD-10-CM | POA: Insufficient documentation

## 2023-11-21 NOTE — Assessment & Plan Note (Addendum)
 She is encouraged to strive for BMI less than 27 to decrease cardiac risk. Advised to aim for at least 150 minutes of exercise per week.

## 2023-11-21 NOTE — Assessment & Plan Note (Signed)
 Persistent swelling and discomfort post-surgery with normal imaging. Considering second opinion. - Recommend over-the-counter Voltaren cream for swelling. - Encourage seeking a second opinion from another orthopedic specialist.

## 2023-11-21 NOTE — Assessment & Plan Note (Signed)
 Previous labs reviewed, her A1c has been elevated in the past. I will check an A1c today. Reminded to avoid refined sugars including sugary drinks/foods and processed meats including bacon, sausages and deli meats.

## 2023-11-21 NOTE — Assessment & Plan Note (Signed)
 Chronic, currently taking levothyroxine daily M-Sat, skipping Sundays. She feels well on this regimen. Most recent labs reviewed, will recheck thyroid labs at her next visit.

## 2023-11-22 ENCOUNTER — Other Ambulatory Visit: Payer: Self-pay | Admitting: Internal Medicine

## 2023-11-22 DIAGNOSIS — E039 Hypothyroidism, unspecified: Secondary | ICD-10-CM

## 2023-12-27 ENCOUNTER — Other Ambulatory Visit: Payer: Self-pay | Admitting: Internal Medicine

## 2023-12-27 DIAGNOSIS — E039 Hypothyroidism, unspecified: Secondary | ICD-10-CM

## 2023-12-27 DIAGNOSIS — E1165 Type 2 diabetes mellitus with hyperglycemia: Secondary | ICD-10-CM

## 2024-01-25 DIAGNOSIS — Z1231 Encounter for screening mammogram for malignant neoplasm of breast: Secondary | ICD-10-CM | POA: Diagnosis not present

## 2024-01-25 DIAGNOSIS — L659 Nonscarring hair loss, unspecified: Secondary | ICD-10-CM | POA: Diagnosis not present

## 2024-01-25 DIAGNOSIS — Z8742 Personal history of other diseases of the female genital tract: Secondary | ICD-10-CM | POA: Diagnosis not present

## 2024-01-25 DIAGNOSIS — Z01419 Encounter for gynecological examination (general) (routine) without abnormal findings: Secondary | ICD-10-CM | POA: Diagnosis not present

## 2024-01-25 DIAGNOSIS — E039 Hypothyroidism, unspecified: Secondary | ICD-10-CM | POA: Diagnosis not present

## 2024-01-25 DIAGNOSIS — E559 Vitamin D deficiency, unspecified: Secondary | ICD-10-CM | POA: Diagnosis not present

## 2024-01-25 DIAGNOSIS — R7303 Prediabetes: Secondary | ICD-10-CM | POA: Diagnosis not present

## 2024-01-25 LAB — HM MAMMOGRAPHY

## 2024-01-29 LAB — CBC AND DIFFERENTIAL
HCT: 39 (ref 36–46)
Hemoglobin: 12.7 (ref 12.0–16.0)
Neutrophils Absolute: 1.9
Platelets: 383 K/uL (ref 150–400)
WBC: 4.4

## 2024-01-29 LAB — BASIC METABOLIC PANEL WITH GFR
BUN: 13 (ref 4–21)
CO2: 20 (ref 13–22)
Chloride: 102 (ref 99–108)
Creatinine: 0.9 (ref 0.5–1.1)
Glucose: 95
Potassium: 4.2 meq/L (ref 3.5–5.1)
Sodium: 138 (ref 137–147)

## 2024-01-29 LAB — COMPREHENSIVE METABOLIC PANEL WITH GFR
Albumin: 4.5 (ref 3.5–5.0)
Calcium: 9.7 (ref 8.7–10.7)
Globulin: 2.8
eGFR: 77

## 2024-01-29 LAB — HEPATIC FUNCTION PANEL
ALT: 15 U/L (ref 7–35)
AST: 15 (ref 13–35)

## 2024-01-29 LAB — TSH: TSH: 1.94 (ref 0.41–5.90)

## 2024-01-29 LAB — LAB REPORT - SCANNED
A1c: 6.1
EGFR: 77
TSH: 1.94

## 2024-01-29 LAB — CBC: RBC: 4.59 (ref 3.87–5.11)

## 2024-02-02 LAB — HM PAP SMEAR: HPV, high-risk: NEGATIVE

## 2024-02-24 DIAGNOSIS — R9230 Dense breasts, unspecified: Secondary | ICD-10-CM | POA: Diagnosis not present

## 2024-02-29 DIAGNOSIS — R928 Other abnormal and inconclusive findings on diagnostic imaging of breast: Secondary | ICD-10-CM | POA: Diagnosis not present

## 2024-02-29 DIAGNOSIS — N6001 Solitary cyst of right breast: Secondary | ICD-10-CM | POA: Diagnosis not present

## 2024-03-06 ENCOUNTER — Other Ambulatory Visit: Payer: Self-pay | Admitting: Internal Medicine

## 2024-03-06 ENCOUNTER — Encounter: Payer: Self-pay | Admitting: Internal Medicine

## 2024-03-06 ENCOUNTER — Ambulatory Visit: Payer: Self-pay | Admitting: Internal Medicine

## 2024-03-06 VITALS — BP 124/80 | HR 95 | Temp 98.4°F | Ht 67.0 in | Wt 193.6 lb

## 2024-03-06 DIAGNOSIS — E039 Hypothyroidism, unspecified: Secondary | ICD-10-CM

## 2024-03-06 DIAGNOSIS — R928 Other abnormal and inconclusive findings on diagnostic imaging of breast: Secondary | ICD-10-CM

## 2024-03-06 DIAGNOSIS — E785 Hyperlipidemia, unspecified: Secondary | ICD-10-CM | POA: Diagnosis not present

## 2024-03-06 DIAGNOSIS — Z0000 Encounter for general adult medical examination without abnormal findings: Secondary | ICD-10-CM

## 2024-03-06 DIAGNOSIS — R7303 Prediabetes: Secondary | ICD-10-CM | POA: Diagnosis not present

## 2024-03-06 DIAGNOSIS — Z23 Encounter for immunization: Secondary | ICD-10-CM | POA: Diagnosis not present

## 2024-03-06 MED ORDER — LEVOTHYROXINE SODIUM 100 MCG PO TABS: ORAL_TABLET | ORAL | 1 refills | Status: DC

## 2024-03-06 NOTE — Assessment & Plan Note (Signed)
 A full exam was performed.  Importance of monthly self breast exams was discussed with the patient.  She is advised to get 30-45 minutes of regular exercise, no less than four to five days per week. Both weight-bearing and aerobic exercises are recommended.  She is advised to follow a healthy diet with at least six fruits/veggies per day, decrease intake of red meat and other saturated fats and to increase fish intake to twice weekly.  Meats/fish should not be fried -- baked, boiled or broiled is preferable. It is also important to cut back on your sugar intake.  Be sure to read labels - try to avoid anything with added sugar, high fructose corn syrup or other sweeteners.  If you must use a sweetener, you can try stevia or monkfruit.  It is also important to avoid artificially sweetened foods/beverages and diet drinks. Lastly, wear SPF 50 sunscreen on exposed skin and when in direct sunlight for an extended period of time.  Be sure to avoid fast food restaurants and aim for at least 60 ounces of water  daily.    - Labs from Dr. Henry reviewed in detail, results discussed with patient.

## 2024-03-06 NOTE — Assessment & Plan Note (Signed)
 Well-managed on levothyroxine  100 mcg. TSH within normal range. Current regimen includes skipping Sundays. - Send prescription for 90-day supply of levothyroxine .

## 2024-03-06 NOTE — Patient Instructions (Signed)

## 2024-03-06 NOTE — Assessment & Plan Note (Signed)
 Symptoms include night sweats, sleep disturbances, and hair thinning. Veozah effective in symptom management. Liver function monitoring necessary. - Perform liver function test for Veozah monitoring. - Continue Veozah at night.

## 2024-03-06 NOTE — Progress Notes (Signed)
 I,Kara Fry, CMA,acting as a neurosurgeon for Kara LOISE Slocumb, MD.,have documented all relevant documentation on the behalf of Kara LOISE Slocumb, MD,as directed by  Kara LOISE Slocumb, MD while in the presence of Kara LOISE Slocumb, MD.  Subjective:    Patient ID: Kara Fry , female    DOB: 1970-07-15 , 53 y.o.   MRN: [id number]  Chief Complaint  Patient presents with   Annual Exam    Patient presents today for her annual exam. Her husband was present for her exam. She is followed by Dr.Roberts for her GYN care. She denies having chest pain, shortness of breath, and palpitations.     HPI Discussed the use of AI scribe software for clinical note transcription with the patient, who gave verbal consent to proceed.  History of Present Illness Kara Fry is a 53 year old female who presents for an annual physical exam.  She is currently taking Veozah, which she started in October 2025, and is due for a liver function test as part of the monitoring for this medication. She takes Veozah at night and has not been consistent enough with it to notice significant effects yet. She is also on levothyroxine  100 mcg, skipping Sundays, and receives a 30-day supply. Additionally, she takes metformin  twice a day with meals and has not had an A1c above 6.5.  She had a mammogram recently and was called back due to some density, which led to an ultrasound. She was told that the findings appeared benign and is scheduled for a retest in six months. There is a cyst and density in the right breast, and she has requested aspiration of the cyst, which requires a referral as her current provider does not perform this procedure on-site.  She experiences night sweats, sleep disturbances, and hair thinning, which she attributes to menopause. She has been taking collagen for about two months, which she feels has reduced her appetite. She also takes a series of vitamins, including 'mental chill', vitamin D , and hair,  nails, and skin supplements, mostly in gummy form. She notes regular bowel movements every three days and takes her vitamins around lunchtime.  She underwent a knee replacement and feels that her left knee is now overcompensating for the right knee, causing discomfort. She has been incorporating walking into her routine about five days a week and has previously engaged in strength training to regain knee strength.   Past Medical History:  Diagnosis Date   History of anemia    Hypothyroidism    followed by pcp   OA (osteoarthritis)    SUI (stress urinary incontinence, female)    Type 2 diabetes mellitus (HCC)    followed by pcp   (12-05-2021 per stated she does not check blood sugar)   Wears glasses      Family History  Problem Relation Age of Onset   Breast cancer Mother 34   Breast cancer Sister 62   Breast cancer Maternal Aunt    Breast cancer Maternal Grandmother      Current Outpatient Medications:    Cholecalciferol (VITAMIN D -3 PO), Take 1 capsule by mouth daily., Disp: , Rfl:    Fezolinetant (VEOZAH) 45 MG TABS, Take by mouth., Disp: , Rfl:    metFORMIN  (GLUCOPHAGE ) 500 MG tablet, TAKE ONE TABLET BY MOUTH TWICE DAILY, Disp: 180 tablet, Rfl: 1   triamcinolone  cream (KENALOG ) 0.1 %, APPLY TO AFFECTED AREA TWICE DAILY AS NEEDED, Disp: 30 g, Rfl: 0   levothyroxine  (SYNTHROID ) 100 MCG tablet,  TAKE ONE TABLET BY MOUTH DAILY MONDAY THROUGH SATURDAY. **skip SUNDAY, Disp: 30 tablet, Rfl: 1   Allergies  Allergen Reactions   Penicillins Shortness Of Breath and Swelling   Shellfish Allergy Shortness Of Breath and Swelling    ALL TYPES OF SHELLFISH      The patient states she uses none for birth control. No LMP recorded. (Menstrual status: Perimenopausal).. Negative for Dysmenorrhea. Negative for: breast discharge, breast lump(s), breast pain and breast self exam. Associated symptoms include abnormal vaginal bleeding. Pertinent negatives include abnormal bleeding (hematology),  anxiety, decreased libido, depression, difficulty falling sleep, dyspareunia, history of infertility, nocturia, sexual dysfunction, sleep disturbances, urinary incontinence, urinary urgency, vaginal discharge and vaginal itching. Diet regular.    . The patient's tobacco use is:  Social History   Tobacco Use  Smoking Status Never  Smokeless Tobacco Never  . She has been exposed to passive smoke. The patient's alcohol use is:  Social History   Substance and Sexual Activity  Alcohol Use Never    Review of Systems  Constitutional: Negative.   HENT: Negative.    Eyes: Negative.   Respiratory: Negative.    Cardiovascular: Negative.   Gastrointestinal: Negative.   Endocrine: Negative.   Genitourinary: Negative.   Musculoskeletal: Negative.   Skin: Negative.   Allergic/Immunologic: Negative.   Neurological: Negative.   Hematological: Negative.   Psychiatric/Behavioral: Negative.       Today's Vitals   03/06/24 1101  BP: 124/80  Pulse: 95  Temp: 98.4 F (36.9 C)  SpO2: 98%  Weight: 193 lb 9.6 oz (87.8 kg)  Height: 5' 7 (1.702 m)   Body mass index is 30.32 kg/m.  Wt Readings from Last 3 Encounters:  03/06/24 193 lb 9.6 oz (87.8 kg)  11/15/23 196 lb (88.9 kg)  07/08/23 201 lb (91.2 kg)     Objective:  Physical Exam Vitals and nursing note reviewed.  Constitutional:      Appearance: Normal appearance.  HENT:     Head: Normocephalic and atraumatic.     Right Ear: Tympanic membrane, ear canal and external ear normal.     Left Ear: Tympanic membrane, ear canal and external ear normal.     Nose: Nose normal.     Mouth/Throat:     Mouth: Mucous membranes are moist.     Pharynx: Oropharynx is clear.  Eyes:     Extraocular Movements: Extraocular movements intact.     Conjunctiva/sclera: Conjunctivae normal.     Pupils: Pupils are equal, round, and reactive to light.  Cardiovascular:     Rate and Rhythm: Normal rate and regular rhythm.     Pulses: Normal pulses.      Heart sounds: Normal heart sounds.  Pulmonary:     Effort: Pulmonary effort is normal.     Breath sounds: Normal breath sounds.  Chest:  Breasts:    Tanner Score is 5.       Comments: Dense breast tissue b/l Palpable mass Abdominal:     General: Abdomen is flat. Bowel sounds are normal.     Palpations: Abdomen is soft.  Genitourinary:    Comments: deferred Musculoskeletal:        General: Normal range of motion.     Cervical back: Normal range of motion and neck supple.  Skin:    General: Skin is warm and dry.  Neurological:     General: No focal deficit present.     Mental Status: She is alert and oriented to person, place, and time.  Psychiatric:        Mood and Affect: Mood normal.        Behavior: Behavior normal.         Assessment And Plan:     Encounter for general adult medical examination w/o abnormal findings Assessment & Plan: A full exam was performed.  Importance of monthly self breast exams was discussed with the patient.  She is advised to get 30-45 minutes of regular exercise, no less than four to five days per week. Both weight-bearing and aerobic exercises are recommended.  She is advised to follow a healthy diet with at least six fruits/veggies per day, decrease intake of red meat and other saturated fats and to increase fish intake to twice weekly.  Meats/fish should not be fried -- baked, boiled or broiled is preferable. It is also important to cut back on your sugar intake.  Be sure to read labels - try to avoid anything with added sugar, high fructose corn syrup or other sweeteners.  If you must use a sweetener, you can try stevia or monkfruit.  It is also important to avoid artificially sweetened foods/beverages and diet drinks. Lastly, wear SPF 50 sunscreen on exposed skin and when in direct sunlight for an extended period of time.  Be sure to avoid fast food restaurants and aim for at least 60 ounces of water  daily.    - Labs from Dr. Henry reviewed in  detail, results discussed with patient.   Orders: -     Hepatic function panel  Acquired hypothyroidism Assessment & Plan: Well-managed on levothyroxine  100 mcg. TSH within normal range. Current regimen includes skipping Sundays. - Send prescription for 90-day supply of levothyroxine .   Prediabetes Assessment & Plan: Previous labs reviewed, her A1c has been elevated in the past. Reminded to avoid refined sugars including sugary drinks/foods and processed meats including bacon, sausages and deli meats.  - Recent labs from Dr. Henry reviewed, A1c was 6.1. - Continue with metformin .    Dyslipidemia Assessment & Plan: Elevated cholesterol with LDL at 141. Discussed increased cardiovascular risk post-menopause. Lipoprotein A at 62 not in risk factor region (above 75 is an independent risk factor) - Consider cardiac calcium score in the future.  Orders: -     CT CARDIAC SCORING (SELF PAY ONLY); Future  Abnormal mammogram of right breast Assessment & Plan: Symptoms include night sweats, sleep disturbances, and hair thinning. Veozah effective in symptom management. Liver function monitoring necessary. - Perform liver function test for Veozah monitoring. - Continue Veozah at night.   Immunization due -     Tdap vaccine greater than or equal to 7yo IM    Return in 6 months (on 09/03/2024), or prediabetes f/u, for 1 year physical. Patient was given opportunity to ask questions. Patient verbalized understanding of the plan and was able to repeat key elements of the plan. All questions were answered to their satisfaction.    I, Kara LOISE Slocumb, MD, have reviewed all documentation for this visit. The documentation on 03/06/24 for the exam, diagnosis, procedures, and orders are all accurate and complete.

## 2024-03-06 NOTE — Assessment & Plan Note (Signed)
 Previous labs reviewed, her A1c has been elevated in the past. Reminded to avoid refined sugars including sugary drinks/foods and processed meats including bacon, sausages and deli meats.  - Recent labs from Dr. Henry reviewed, A1c was 6.1. - Continue with metformin .

## 2024-03-06 NOTE — Assessment & Plan Note (Signed)
 Elevated cholesterol with LDL at 141. Discussed increased cardiovascular risk post-menopause. Lipoprotein A at 60 not in risk factor region (above 75 is an independent risk factor) - Consider cardiac calcium score in the future.

## 2024-03-07 LAB — HEPATIC FUNCTION PANEL
ALT: 13 IU/L (ref 0–32)
AST: 14 IU/L (ref 0–40)
Albumin: 4.5 g/dL (ref 3.8–4.9)
Alkaline Phosphatase: 109 IU/L (ref 49–135)
Bilirubin Total: 0.6 mg/dL (ref 0.0–1.2)
Bilirubin, Direct: 0.2 mg/dL (ref 0.00–0.40)
Total Protein: 7.7 g/dL (ref 6.0–8.5)

## 2024-03-08 ENCOUNTER — Ambulatory Visit: Payer: Self-pay | Admitting: Internal Medicine

## 2024-05-30 LAB — OPHTHALMOLOGY REPORT-SCANNED

## 2024-09-04 ENCOUNTER — Ambulatory Visit: Admitting: Internal Medicine

## 2025-03-14 ENCOUNTER — Encounter: Payer: Self-pay | Admitting: Internal Medicine
